# Patient Record
Sex: Female | Born: 1955 | Race: White | Hispanic: No | Smoking: Never smoker
Health system: Southern US, Community
[De-identification: ages and names within clinical notes are randomized; demographics above are authoritative.]

## PROBLEM LIST (undated history)

## (undated) DIAGNOSIS — I10 Essential (primary) hypertension: Secondary | ICD-10-CM

## (undated) DIAGNOSIS — Z87442 Personal history of urinary calculi: Secondary | ICD-10-CM

## (undated) DIAGNOSIS — R569 Unspecified convulsions: Secondary | ICD-10-CM

## (undated) DIAGNOSIS — J189 Pneumonia, unspecified organism: Secondary | ICD-10-CM

## (undated) DIAGNOSIS — G43909 Migraine, unspecified, not intractable, without status migrainosus: Secondary | ICD-10-CM

## (undated) DIAGNOSIS — H3530 Unspecified macular degeneration: Secondary | ICD-10-CM

## (undated) DIAGNOSIS — D649 Anemia, unspecified: Secondary | ICD-10-CM

## (undated) DIAGNOSIS — K759 Inflammatory liver disease, unspecified: Secondary | ICD-10-CM

## (undated) DIAGNOSIS — I499 Cardiac arrhythmia, unspecified: Secondary | ICD-10-CM

## (undated) DIAGNOSIS — M1990 Unspecified osteoarthritis, unspecified site: Secondary | ICD-10-CM

## (undated) DIAGNOSIS — F32A Depression, unspecified: Secondary | ICD-10-CM

## (undated) DIAGNOSIS — E119 Type 2 diabetes mellitus without complications: Secondary | ICD-10-CM

## (undated) DIAGNOSIS — T7840XA Allergy, unspecified, initial encounter: Secondary | ICD-10-CM

## (undated) DIAGNOSIS — G4730 Sleep apnea, unspecified: Secondary | ICD-10-CM

## (undated) DIAGNOSIS — F419 Anxiety disorder, unspecified: Secondary | ICD-10-CM

## (undated) DIAGNOSIS — F329 Major depressive disorder, single episode, unspecified: Secondary | ICD-10-CM

## (undated) HISTORY — DX: Depression, unspecified: F32.A

## (undated) HISTORY — PX: TONSILLECTOMY: SUR1361

## (undated) HISTORY — DX: Sleep apnea, unspecified: G47.30

## (undated) HISTORY — DX: Allergy, unspecified, initial encounter: T78.40XA

## (undated) HISTORY — DX: Type 2 diabetes mellitus without complications: E11.9

## (undated) HISTORY — PX: JOINT REPLACEMENT: SHX530

## (undated) HISTORY — DX: Migraine, unspecified, not intractable, without status migrainosus: G43.909

## (undated) HISTORY — DX: Major depressive disorder, single episode, unspecified: F32.9

## (undated) HISTORY — PX: COLONOSCOPY: SHX174

## (undated) HISTORY — DX: Anxiety disorder, unspecified: F41.9

## (undated) HISTORY — DX: Unspecified convulsions: R56.9

---

## 1980-05-11 HISTORY — PX: TUBAL LIGATION: SHX77

## 1980-05-11 HISTORY — PX: APPENDECTOMY: SHX54

## 2000-11-30 ENCOUNTER — Encounter: Admission: RE | Admit: 2000-11-30 | Discharge: 2000-11-30 | Payer: Self-pay | Admitting: Internal Medicine

## 2000-11-30 ENCOUNTER — Encounter: Payer: Self-pay | Admitting: Internal Medicine

## 2001-11-29 ENCOUNTER — Encounter: Admission: RE | Admit: 2001-11-29 | Discharge: 2001-11-29 | Payer: Self-pay | Admitting: Internal Medicine

## 2001-11-29 ENCOUNTER — Encounter: Payer: Self-pay | Admitting: Internal Medicine

## 2001-12-22 ENCOUNTER — Other Ambulatory Visit: Admission: RE | Admit: 2001-12-22 | Discharge: 2001-12-22 | Payer: Self-pay | Admitting: Internal Medicine

## 2004-09-12 ENCOUNTER — Other Ambulatory Visit: Admission: RE | Admit: 2004-09-12 | Discharge: 2004-09-12 | Payer: Self-pay | Admitting: Internal Medicine

## 2004-10-28 ENCOUNTER — Ambulatory Visit (HOSPITAL_COMMUNITY): Admission: RE | Admit: 2004-10-28 | Discharge: 2004-10-28 | Payer: Self-pay | Admitting: Internal Medicine

## 2004-12-12 ENCOUNTER — Encounter: Admission: RE | Admit: 2004-12-12 | Discharge: 2004-12-12 | Payer: Self-pay | Admitting: Neurology

## 2005-06-13 ENCOUNTER — Emergency Department (HOSPITAL_COMMUNITY): Admission: EM | Admit: 2005-06-13 | Discharge: 2005-06-13 | Payer: Self-pay | Admitting: Emergency Medicine

## 2005-12-02 ENCOUNTER — Encounter: Admission: RE | Admit: 2005-12-02 | Discharge: 2005-12-02 | Payer: Self-pay | Admitting: Internal Medicine

## 2006-05-31 ENCOUNTER — Ambulatory Visit (HOSPITAL_COMMUNITY): Admission: RE | Admit: 2006-05-31 | Discharge: 2006-05-31 | Payer: Self-pay | Admitting: *Deleted

## 2006-10-27 ENCOUNTER — Encounter: Admission: RE | Admit: 2006-10-27 | Discharge: 2006-10-27 | Payer: Self-pay | Admitting: Internal Medicine

## 2006-11-10 ENCOUNTER — Emergency Department (HOSPITAL_COMMUNITY): Admission: EM | Admit: 2006-11-10 | Discharge: 2006-11-10 | Payer: Self-pay | Admitting: Emergency Medicine

## 2008-04-16 ENCOUNTER — Encounter: Admission: RE | Admit: 2008-04-16 | Discharge: 2008-04-16 | Payer: Self-pay | Admitting: Internal Medicine

## 2010-01-03 ENCOUNTER — Encounter: Admission: RE | Admit: 2010-01-03 | Discharge: 2010-01-03 | Payer: Self-pay | Admitting: Internal Medicine

## 2010-01-24 ENCOUNTER — Other Ambulatory Visit: Admission: RE | Admit: 2010-01-24 | Discharge: 2010-01-24 | Payer: Self-pay | Admitting: Internal Medicine

## 2010-01-30 ENCOUNTER — Ambulatory Visit (HOSPITAL_COMMUNITY): Admission: RE | Admit: 2010-01-30 | Discharge: 2010-01-30 | Payer: Self-pay | Admitting: Urology

## 2010-09-23 NOTE — Consult Note (Signed)
NAMEHEATH, Mallory Mills                  ACCOUNT NO.:  [id number]   MEDICAL RECORD NO.:  [id number]          PATIENT TYPE:  EMS   LOCATION:  MAJO                         FACILITY:  [facility]   PHYSICIAN:  Michael L. Reynolds, M.D.DATE OF BIRTH:  27-Feb-1956   DATE OF CONSULTATION:  11/10/2006  DATE OF DISCHARGE:                                 CONSULTATION   EMERGENCY ROOM CONSULTATION:   REFERRING PHYSICIAN:  Dr. Gerlene Burdock Pang/Dr. Delia Heady.   CHIEF COMPLAINT:  Headache and eye pain following trauma.   HISTORY OF PRESENT ILLNESS:  This is an emergency room consultation  evaluation of this existing [facility] patient, a 55-  year-old woman with a history of migraine, who fell and hit the left  side of her head about 2 weeks ago.  Since then, she has had some  persistent dizziness.  However, she has not had significant problems  with headaches or eye pain.  She has seen her primary care doctor a few  times and has been placed on meclizine.  She had CT of the head the day  after her trauma and that study was normal.  Today, she woke up with a  pain in her left eye, going back into her head.  She has had associated  nausea with this, but no vomiting, photophobia or ponophobia.  She feels  a little bit of deviation on the left eye, is a little bit blurry  compared to the right.  She denies any diplopia.  She denies any  associated focal numbness, tingling or weakness.  She called the office  of [facility] and was advised to come to the  emergency department for an urgent neurologic evaluation.   REVIEW OF SYSTEMS:  No recent headaches, although the patient does have  a history of headaches diagnosed as migraines in the past.   PAST MEDICAL HISTORY:  She has:  1. A history of epilepsy for which she is on Lamictal.  2. She also has a history of anxiety, depression and restless leg      syndrome.   MEDICATIONS:  Lamictal, Restoril, Cymbalta, Klonopin,  Requip,  metoprolol.   PHYSICAL EXAMINATION:  VITAL SIGNS:  Temperature 98.1.  Blood pressure  147/96.  Pulse of 91.  Respirations 18.  O2 saturation 95% on room air.  GENERAL EXAMINATION:  This is a healthy-appearing woman seen in no  evident distress.  HEENT:  Head:  Cranium is normocephalic, atraumatic.  Oropharynx benign.  NECK:  Supple without carotid or supraclavicular bruits.  HEART:  Regular rate and rhythm without murmurs.  NEUROLOGIC EXAMINATION:  Mental status:  She is awake and alert.  She is  oriented to time, place and person.  Recent and remote are intact.  Attention span, concentration and fund of knowledge are all appropriate.  Speech is fluent and not dysarthric.  There are no defects to  confrontational, naming and she can repeat a phrase.  Mood is euthymic  and affect appropriate.  Cranial nerves:  Pupils are equal and reactive.  Extraocular movements are full without nystagmus.  Visual  fields are  full to confrontation.  Hearing is intact to conversational speech.  Visual acuity in the left eye is 20/200, in the right eye 20/70 and in  both eyes 20/50.  Face, tongue and palate move normally and  symmetrically.  Motor:  Normal bulk and tone.  Normal strength in all  tested extremity muscles.  Sensation intact to light touch in all  extremities.  Coordination:  Finger-to-nose and heel-to-shin are  performed well.  Gait:  She arises easily from the stretcher and her  stance is normal.  She is able to tandem walk without difficulty.  Reflexes are 2+ and symmetric.  Toes are downgoing bilaterally.   LABORATORY REVIEWS:  CBC and chemistries are normal.  CT of the head is  normal.   IMPRESSION:  1. Left eye pain with visual blurring.  This most likely represents a      migraine in this patient with a recent head trauma.  There is no      evidence of any neurologic injury.  2. History of migraine.  3. History of recent head trauma with mild postconcussive syndrome.    PLAN:  She is advised to take Tylenol this evening.  If her symptoms  persist into tomorrow, she should follow up with her eye doctor.  She  can also call our office for other medication considerations.      Michael L. Thad Ranger, M.D.  Electronically Signed     MLR/MEDQ  D:  11/10/2006  T:  11/11/2006  Job:  [id number]

## 2010-09-26 NOTE — Op Note (Signed)
M[place]ory Mills, Mallory Mills                  ACCOUNT NO.:  [id number]   MEDICAL RECORD NO.:  [id number]          PATIENT TYPE:  AMB   LOCATION:  ENDO                         FACILITY:  [facility]   PHYSICIAN:  Georgiana Spinner, M.D.    DATE OF BIRTH:  1955/10/23   DATE OF PROCEDURE:  05/31/2006  DATE OF DISCHARGE:                               OPERATIVE REPORT   PROCEDURE:  Colonoscopy.   INDICATIONS:  Colon cancer screening.   ANESTHESIA:  Fentanyl 50 mcg, Versed 7 mg   DESCRIPTION OF PROCEDURE:  With the patient mildly sedated in the left  lateral decubitus position; she was subsequently rolled to her back.  With pressure applied, the Pentax videoscopic colonoscope was inserted  into the rectum and passed under direct vision to the cecum identified  by ileocecal valve and appendiceal orifice both of which were  photographed.  From this point, the colonoscope was slowly withdrawn  taking circumferential views of colonic mucosa stopping only in the  rectum which appeared normal on direct.  It showed hemorrhoids on  retroflexed view.  The endoscope was straightened and withdrawn.  The  patient's vital signs; and pulse oximeter remained stable.  The patient  tolerated the procedure well without apparent complications.   FINDINGS:  Internal hemorrhoids, otherwise an unremarkable examination.  There were diverticula seen as well.   PLAN:  Have the patient follow up with me in 5-10 years or as needed.           ______________________________  Georgiana Spinner, M.D.     GMO/MEDQ  D:  05/31/2006  T:  05/31/2006  Job:  [id number]

## 2010-09-26 NOTE — Op Note (Signed)
M[place] Mills, Mallory Mills                  ACCOUNT NO.:  [id number]   MEDICAL RECORD NO.:  [id number]          PATIENT TYPE:  AMB   LOCATION:  ENDO                         FACILITY:  [facility]   PHYSICIAN:  Georgiana Spinner, M.D.    DATE OF BIRTH:  1956/04/10   DATE OF PROCEDURE:  05/31/2006  DATE OF DISCHARGE:                               OPERATIVE REPORT   SURGEON:  Georgiana Spinner, M.D.   PROCEDURE:  Upper endoscopy.   INDICATIONS:  GERD.   ANESTHESIA:  Fentanyl 50 mcg, Versed 5 mg.   PROCEDURE:  With the patient mildly sedated in the left lateral  decubitus position, the Pentax videoscopic endoscope was inserted in the  mouth and passed under direct vision through the esophagus, which  appeared normal, and into the stomach.  The fundus, body, antrum,  duodenal bulb and second portion of the duodenum appeared normal.  From  this point, the endoscope was slowly withdrawn, taking circumferential  views of the duodenal mucosa, until the endoscope was then pulled back  into the stomach and placed in retroflexion to view the stomach from  below.  The endoscope was then straightened and withdrawn, taking  circumferential views of the remaining gastric and esophageal mucosa.  The patient's vital signs and pulse oximetry remained stable.  The  patient tolerated the procedure well without apparent complication.   FINDINGS:  Unremarkable examination.   PLAN:  Proceed to colonoscopy.           ______________________________  Georgiana Spinner, M.D.     GMO/MEDQ  D:  05/31/2006  T:  05/31/2006  Job:  [id number]

## 2010-09-26 NOTE — Procedures (Signed)
CLINICAL HISTORY:  A 54 year old lady with episode of passing out for 3 to 4  minutes _________ of body or so due to the fall. EEG is being done to  evaluate for seizure activity.   MEDICATIONS:  Zoloft, metoprolol, Prempro, and Temazepam.   DESCRIPTION OF PROCEDURE:  This is a 17 channel EEG performed during wakeful  states and light sleep using standard 10/20 electrode placement. Background  awake rhythm consists of 9 to 10 hertz alpha, which is poorly formed  synchronous active to opening and closure. It is mixed with 6 to 7 hertz  theta activity in a diffuse fashion. Intermittent left temporal sharp waves  and focal slowing is seen with phase reversal over T3 and T5 electrodes. No  paroxysmal epileptiform activity is identified. Hyperventilation results in  generalized slowing. Photic stimulation results in no significant driving  response. Length of the tracing is 24.2 minutes. Sleep traces are not  achieved except for mild drowsiness seen towards the end of the tracing. EKG  tracing reveals regular sinus rhythm. Technical component is average.  Excessive _________ is seen throughout the tracing, which may be a  medication effect.   IMPRESSION:  This EEG is abnormal due to the presence of focal left temporal  cortical irritability and slowing. No definite epileptiform activity,  however, is identified. If seizures are strongly suspected, a repeat sleep  deprived study may be beneficial.       ZOX:WRUE  D:  10/28/2004 15:13:41  T:  10/29/2004 01:35:26  Job #:  [id number]

## 2011-01-14 ENCOUNTER — Ambulatory Visit (INDEPENDENT_AMBULATORY_CARE_PROVIDER_SITE_OTHER): Payer: 59 | Admitting: General Surgery

## 2011-01-14 ENCOUNTER — Encounter (INDEPENDENT_AMBULATORY_CARE_PROVIDER_SITE_OTHER): Payer: Self-pay | Admitting: General Surgery

## 2011-01-14 VITALS — BP 124/86 | HR 80 | Temp 97.8°F | Ht 63.0 in | Wt 197.2 lb

## 2011-01-14 DIAGNOSIS — IMO0002 Reserved for concepts with insufficient information to code with codable children: Secondary | ICD-10-CM

## 2011-01-14 DIAGNOSIS — L02419 Cutaneous abscess of limb, unspecified: Secondary | ICD-10-CM

## 2011-01-14 NOTE — Patient Instructions (Signed)
Wash the area twice daily and keep covered. Finishing here antibiotics. Call if any increasing pain, redness, swelling, drainage, or fever.

## 2011-01-14 NOTE — Progress Notes (Signed)
History: Patient is a 55 year old female here through the courtesy of [facility] for treatment of a right axillary abscess. The patient began to develop some pain and swelling last week. She was seen Monday and incision and drainage was performed. She was followed up today, 2 days later and the drain was removed. There was concern about continued induration and erythema and she is referred for evaluation. She received 1 g of Rocephin IV today and is given a prescription for Septra. The patient states that since the drain or gauze was removed it is feeling significantly better and better than it did before the I&D. She has had one previous abscess in the area treated.  Exam: She is afebrile and in no distress. In the anterior right axilla is a recent open I&D site and about 4-5 cm of surrounding moderate erythema and induration. I did not feel any areas of fluctuance. The wound was explored and cleaned with a cotton applicator and appears well drained.  Assessment and plan: Status post I&D of right axillary abscess. She has some residual inflammation but I do not see any evidence that this requires further drainage. She appears to be on appropriate antibiotics. We discussed signs to watch for and she will call for any concerns. She will call next week for followup if this is not essentially resolved.

## 2011-02-24 LAB — CBC
Platelets: 315
RBC: 4.78
RDW: 13.9

## 2011-02-24 LAB — DIFFERENTIAL
Basophils Relative: 0
Lymphocytes Relative: 25
Lymphs Abs: 1.3

## 2011-02-24 LAB — I-STAT 8, (EC8 V) (CONVERTED LAB)
Bicarbonate: 26 — ABNORMAL HIGH
Potassium: 3.6
Sodium: 138
pH, Ven: 7.438 — ABNORMAL HIGH

## 2011-02-24 LAB — POCT I-STAT CREATININE: Operator id: 146091

## 2012-02-01 ENCOUNTER — Other Ambulatory Visit: Payer: Self-pay | Admitting: Internal Medicine

## 2012-02-01 DIAGNOSIS — R748 Abnormal levels of other serum enzymes: Secondary | ICD-10-CM

## 2012-02-02 ENCOUNTER — Ambulatory Visit
Admission: RE | Admit: 2012-02-02 | Discharge: 2012-02-02 | Disposition: A | Discharge status: Home / Self Care | Payer: 59 | Source: Ambulatory Visit | Attending: Internal Medicine | Admitting: Internal Medicine

## 2012-02-02 DIAGNOSIS — R748 Abnormal levels of other serum enzymes: Secondary | ICD-10-CM

## 2012-09-07 ENCOUNTER — Ambulatory Visit (INDEPENDENT_AMBULATORY_CARE_PROVIDER_SITE_OTHER): Payer: 59 | Admitting: Family Medicine

## 2012-09-07 VITALS — BP 161/84 | HR 66 | Temp 98.0°F | Resp 16 | Ht 63.0 in | Wt 196.0 lb

## 2012-09-07 DIAGNOSIS — R002 Palpitations: Secondary | ICD-10-CM

## 2012-09-07 DIAGNOSIS — G8929 Other chronic pain: Secondary | ICD-10-CM | POA: Insufficient documentation

## 2012-09-07 DIAGNOSIS — M546 Pain in thoracic spine: Secondary | ICD-10-CM

## 2012-09-07 MED ORDER — METHYLPREDNISOLONE 4 MG PO TABS: 8.0000 mg | ORAL_TABLET | Freq: Every day | ORAL | Status: DC

## 2012-09-07 NOTE — Progress Notes (Signed)
57 yo woman with two days of spontaneous thoracic back pain, burning in quality and made worse moving, coughing or sneezing.  No past hx of similar pain. Lately, only other problem has been allergies with some sneezing  Sig Negs:  No leg pains or lower back pain, No significant cough or fever.  Rx:  Ibuprofen and tylenol: no help  Objective:  NAD Chest clear, tender superior aspect of medial scapular edge Skin: clear Heart:  Regular, no murmur' Ext: no edema  Assessment;  Levator scapulae strain  Plan:

## 2013-08-24 ENCOUNTER — Other Ambulatory Visit (HOSPITAL_COMMUNITY)
Admission: RE | Admit: 2013-08-24 | Discharge: 2013-08-24 | Disposition: A | Discharge status: Home / Self Care | Payer: 59 | Source: Ambulatory Visit | Attending: Internal Medicine | Admitting: Internal Medicine

## 2013-08-24 DIAGNOSIS — Z01419 Encounter for gynecological examination (general) (routine) without abnormal findings: Secondary | ICD-10-CM | POA: Insufficient documentation

## 2014-08-30 ENCOUNTER — Emergency Department (HOSPITAL_COMMUNITY): Payer: Worker's Compensation

## 2014-08-30 ENCOUNTER — Encounter (HOSPITAL_COMMUNITY): Payer: Self-pay | Admitting: Emergency Medicine

## 2014-08-30 ENCOUNTER — Emergency Department (HOSPITAL_COMMUNITY)
Admission: EM | Admit: 2014-08-30 | Discharge: 2014-08-30 | Disposition: A | Discharge status: Home / Self Care | Payer: Worker's Compensation | Attending: Emergency Medicine | Admitting: Emergency Medicine

## 2014-08-30 DIAGNOSIS — Z79899 Other long term (current) drug therapy: Secondary | ICD-10-CM | POA: Diagnosis not present

## 2014-08-30 DIAGNOSIS — Y998 Other external cause status: Secondary | ICD-10-CM | POA: Insufficient documentation

## 2014-08-30 DIAGNOSIS — T148 Other injury of unspecified body region: Secondary | ICD-10-CM | POA: Insufficient documentation

## 2014-08-30 DIAGNOSIS — S96912A Strain of unspecified muscle and tendon at ankle and foot level, left foot, initial encounter: Secondary | ICD-10-CM | POA: Insufficient documentation

## 2014-08-30 DIAGNOSIS — S46911A Strain of unspecified muscle, fascia and tendon at shoulder and upper arm level, right arm, initial encounter: Secondary | ICD-10-CM | POA: Insufficient documentation

## 2014-08-30 DIAGNOSIS — F419 Anxiety disorder, unspecified: Secondary | ICD-10-CM | POA: Insufficient documentation

## 2014-08-30 DIAGNOSIS — Y9389 Activity, other specified: Secondary | ICD-10-CM | POA: Insufficient documentation

## 2014-08-30 DIAGNOSIS — I10 Essential (primary) hypertension: Secondary | ICD-10-CM | POA: Insufficient documentation

## 2014-08-30 DIAGNOSIS — T148XXA Other injury of unspecified body region, initial encounter: Secondary | ICD-10-CM

## 2014-08-30 DIAGNOSIS — Y9289 Other specified places as the place of occurrence of the external cause: Secondary | ICD-10-CM | POA: Insufficient documentation

## 2014-08-30 DIAGNOSIS — S29011A Strain of muscle and tendon of front wall of thorax, initial encounter: Secondary | ICD-10-CM | POA: Diagnosis not present

## 2014-08-30 DIAGNOSIS — S3991XA Unspecified injury of abdomen, initial encounter: Secondary | ICD-10-CM | POA: Insufficient documentation

## 2014-08-30 DIAGNOSIS — F329 Major depressive disorder, single episode, unspecified: Secondary | ICD-10-CM | POA: Insufficient documentation

## 2014-08-30 DIAGNOSIS — T07XXXA Unspecified multiple injuries, initial encounter: Secondary | ICD-10-CM

## 2014-08-30 DIAGNOSIS — S060X9A Concussion with loss of consciousness of unspecified duration, initial encounter: Secondary | ICD-10-CM | POA: Insufficient documentation

## 2014-08-30 DIAGNOSIS — S0990XA Unspecified injury of head, initial encounter: Secondary | ICD-10-CM | POA: Diagnosis present

## 2014-08-30 DIAGNOSIS — W108XXA Fall (on) (from) other stairs and steps, initial encounter: Secondary | ICD-10-CM | POA: Diagnosis not present

## 2014-08-30 DIAGNOSIS — G40909 Epilepsy, unspecified, not intractable, without status epilepticus: Secondary | ICD-10-CM | POA: Insufficient documentation

## 2014-08-30 DIAGNOSIS — Z7951 Long term (current) use of inhaled steroids: Secondary | ICD-10-CM | POA: Insufficient documentation

## 2014-08-30 DIAGNOSIS — W19XXXA Unspecified fall, initial encounter: Secondary | ICD-10-CM

## 2014-08-30 HISTORY — DX: Essential (primary) hypertension: I10

## 2014-08-30 MED ORDER — OXYCODONE-ACETAMINOPHEN 5-325 MG PO TABS: 1.0000 | ORAL_TABLET | Freq: Four times a day (QID) | ORAL | Status: DC | PRN

## 2014-08-30 MED ORDER — MORPHINE SULFATE 4 MG/ML IJ SOLN
4.0000 mg | Freq: Once | INTRAMUSCULAR | Status: AC
  Administered 2014-08-30: 4 mg via INTRAVENOUS
  Filled 2014-08-30: qty 1

## 2014-08-30 NOTE — ED Notes (Signed)
Patient still off unit for procedure, sent by another nurse.

## 2014-08-30 NOTE — ED Provider Notes (Signed)
CSN: [id number]     Arrival date & time 08/30/14  1416 History   First MD Initiated Contact with Patient 08/30/14 1459     Chief Complaint  Patient presents with  . Fall  . Knee Pain    left  . Shoulder Pain    right  . Flank Pain    right    Patient is a 59 y.o. female presenting with fall, knee pain, shoulder pain, and flank pain. The history is provided by the patient.  Fall This is a new problem. The current episode started less than 1 hour ago. The problem occurs constantly. The problem has not changed since onset.Associated symptoms include headaches. Pertinent negatives include no abdominal pain and no shortness of breath.  Knee Pain Shoulder Pain Flank Pain Associated symptoms include headaches. Pertinent negatives include no abdominal pain and no shortness of breath.  Pt fell down 9 concrete steps coming out of work today.  Most of the impact was on her left knee and her head.  She is not sure if she briefly lost consciousness.  She was not able to stand after the fall.  She is having pain in her right shoulder, right ribs, left ankle, and left knee as well.  No abdominal pain.  No shortness of breath.    Past Medical History  Diagnosis Date  . Seizures   . Allergy   . Depression   . Anxiety   . Hypertension    Past Surgical History  Procedure Laterality Date  . Cesarean section    . Tubal ligation     Family History  Problem Relation Age of Onset  . Cancer Mother     lung  . Cancer Maternal Aunt     breast  . Cancer Paternal Aunt     breast  . Heart disease Father   . Gout Son   . Heart disease Maternal Grandmother    History  Substance Use Topics  . Smoking status: Never Smoker   . Smokeless tobacco: Not on file  . Alcohol Use: Yes     Comment: 1-2/month   OB History    No data available     Review of Systems  Respiratory: Negative for shortness of breath.   Gastrointestinal: Negative for abdominal pain.  Genitourinary: Positive for flank pain.   Neurological: Positive for headaches.  All other systems reviewed and are negative.     Allergies  Vicodin  Home Medications   Prior to Admission medications   Medication Sig Start Date End Date Taking? Authorizing Provider  clonazePAM (KLONOPIN) 1 MG tablet Take 1 mg by mouth daily as needed for anxiety.   Yes Historical Provider, MD  cyproheptadine (PERIACTIN) 4 MG tablet Take 4 mg by mouth 3 (three) times daily as needed for allergies.   Yes Historical Provider, MD  fluticasone (FLONASE) 50 MCG/ACT nasal spray Place 1 spray into both nostrils daily.   Yes Historical Provider, MD  lamoTRIgine (LAMICTAL) 100 MG tablet Take 100 mg by mouth 2 (two) times daily. i pill in am and 2 pills in pm     Yes Historical Provider, MD  losartan (COZAAR) 100 MG tablet Take 100 mg by mouth daily.   Yes Historical Provider, MD  metoprolol (LOPRESSOR) 50 MG tablet Take 50 mg by mouth 2 (two) times daily.   Yes Historical Provider, MD  ranitidine (ZANTAC) 150 MG tablet Take 150 mg by mouth 2 (two) times daily.   Yes Historical Provider, MD  rizatriptan (  MAXALT-MLT) 10 MG disintegrating tablet Take 10 mg by mouth as needed for migraine. May repeat in 2 hours if needed   Yes Historical Provider, MD  traMADol (ULTRAM) 50 MG tablet Take 50 mg by mouth daily as needed.   Yes Historical Provider, MD  venlafaxine XR (EFFEXOR-XR) 75 MG 24 hr capsule Take 75 mg by mouth daily with breakfast.   Yes Historical Provider, MD  zolpidem (AMBIEN) 10 MG tablet Take 10 mg by mouth at bedtime as needed for sleep.   Yes Historical Provider, MD  ALPRAZolam (XANAX) 0.25 MG tablet Take 0.25 mg by mouth daily.    Historical Provider, MD  butalbital-acetaminophen-caffeine (FIORICET, ESGIC) 50-325-40 MG per tablet Take 1 tablet by mouth as needed for headache.    Historical Provider, MD  methylPREDNISolone (MEDROL) 4 MG tablet Take 2 tablets (8 mg total) by mouth daily. 09/07/12   Elvina Sidle, MD  METOPROLOL TARTRATE PO Take  by mouth 2 (two) times daily.      Historical Provider, MD  oxyCODONE-acetaminophen (PERCOCET/ROXICET) 5-325 MG per tablet Take 1-2 tablets by mouth every 6 (six) hours as needed. 08/30/14   Linwood Dibbles, MD   BP 166/90 mmHg  Pulse 77  Temp(Src) 98.5 F (36.9 C) (Oral)  Resp 17  Ht 5\' 3"  (1.6 m)  Wt 200 lb (90.719 kg)  BMI 35.44 kg/m2  SpO2 100% Physical Exam  Constitutional: No distress.  HENT:  Head: Normocephalic and atraumatic.  Right Ear: External ear normal.  Left Ear: External ear normal.  Eyes: Conjunctivae are normal. Right eye exhibits no discharge. Left eye exhibits no discharge. No scleral icterus.  Neck: Neck supple. No tracheal deviation present.  Wearing a cervical spine collar, No spinous process tenderness  Cardiovascular: Normal rate, regular rhythm and intact distal pulses.   Pulmonary/Chest: Effort normal and breath sounds normal. No stridor. No respiratory distress. She has no wheezes. She has no rales. She exhibits tenderness (right chest wall, no crepitus no ecchymoses).  Abdominal: Soft. Bowel sounds are normal. She exhibits no distension. There is no tenderness. There is no rebound and no guarding.  Musculoskeletal: She exhibits no edema.       Right shoulder: She exhibits tenderness and bony tenderness. She exhibits no swelling and no deformity.       Left shoulder: Normal.       Right elbow: Normal.      Left elbow: Normal.       Right wrist: Normal.       Left wrist: Normal.       Right hip: Normal.       Left hip: Normal.       Right knee: Normal.       Left knee: She exhibits no swelling, no effusion and no laceration (positive for abrasion). Tenderness found.       Right ankle: Normal.       Left ankle: She exhibits no laceration. Tenderness. Lateral malleolus and medial malleolus tenderness found.       Cervical back: Normal.       Thoracic back: Normal.       Lumbar back: Normal.       Right foot: Normal.       Left foot: Normal.  Neurological:  She is alert. She has normal strength. No cranial nerve deficit (no facial droop, extraocular movements intact, no slurred speech) or sensory deficit. She exhibits normal muscle tone. She displays no seizure activity. Coordination normal.  Skin: Skin is warm and dry.  No rash noted. She is not diaphoretic.  Psychiatric: She has a normal mood and affect.  Nursing note and vitals reviewed.   ED Course  Procedures (including critical care time) Labs Review Labs Reviewed - No data to display  Imaging Review Dg Ribs Unilateral W/chest Right  08/30/2014   CLINICAL DATA:  Post fall down 8 concrete stairs leaving work today, now with right lower posterior rib pain. Initial encounter.  EXAM: RIGHT RIBS AND CHEST - 3+ VIEW  COMPARISON:  None.  FINDINGS: No definite displaced right-sided rib fractures. Regional soft tissues appear normal. No radiopaque foreign body.  Borderline enlarged cardiac silhouette and mediastinal contours, potentially accentuated due to decreased lung volumes. Minimal bibasilar opacities, left greater than right, likely atelectasis. No pleural effusion or pneumothorax. No evidence of edema. No acute osseus abnormalities.  IMPRESSION: No definite displaced right-sided rib fractures.   Electronically Signed   By: Simonne ComeJohn  Watts M.D.   On: 08/30/2014 16:19   Dg Shoulder Right  08/30/2014   CLINICAL DATA:  Fall, shoulder pain, flank pain  EXAM: RIGHT SHOULDER - 2+ VIEW  COMPARISON:  None.  FINDINGS: Two views of the right shoulder submitted. No acute fracture or subluxation. Degenerative changes are noted humeral head with inferomedial spurring. Minimal narrowing of glenohumeral joint space.  IMPRESSION: No acute fracture or subluxation. Degenerative changes as described above.   Electronically Signed   By: Natasha MeadLiviu  Pop M.D.   On: 08/30/2014 16:22   Dg Ankle Complete Left  08/30/2014   CLINICAL DATA:  Fall, knee pain, left ankle pain  EXAM: LEFT ANKLE COMPLETE - 3+ VIEW  COMPARISON:  None.   FINDINGS: Three views of the left ankle submitted. No acute fracture or subluxation. Ankle mortise is preserved.  IMPRESSION: Negative.   Electronically Signed   By: Natasha MeadLiviu  Pop M.D.   On: 08/30/2014 16:20   Ct Head Wo Contrast  08/30/2014   CLINICAL DATA:  Fall, tripped, fell down 8 steps, headache  EXAM: CT HEAD WITHOUT CONTRAST  CT CERVICAL SPINE WITHOUT CONTRAST  TECHNIQUE: Multidetector CT imaging of the head and cervical spine was performed following the standard protocol without intravenous contrast. Multiplanar CT image reconstructions of the cervical spine were also generated.  COMPARISON:  11/10/2006  FINDINGS: CT HEAD FINDINGS  No skull fracture is noted. Paranasal sinuses and mastoid air cells are unremarkable.  No intracranial hemorrhage, mass effect or midline shift.  No acute cortical infarction. No mass lesion is noted on this unenhanced scan. The gray and white-matter differentiation is preserved.  CT CERVICAL SPINE FINDINGS  Axial images of the cervical spine shows no acute fracture or subluxation. Computer processed images shows no acute fracture or subluxation. Degenerative changes are noted C1-C2 articulation. There is minimal disc space flattening with mild posterior spurring at C3-C4 level. Moderate disc space flattening with mild anterior and mild posterior spurring at C6-C7 level. Mild disc space flattening with anterior spurring at C7-T1 level. No prevertebral soft tissue swelling. Mild anterior spurring lower endplate of C5 vertebral body. Cervical airway is patent.  There is no pneumothorax in visualized lung apices.  IMPRESSION: 1. No acute intracranial abnormality. 2. No cervical spine acute fracture or subluxation. Degenerative changes as described above.   Electronically Signed   By: Natasha MeadLiviu  Pop M.D.   On: 08/30/2014 16:44   Ct Cervical Spine Wo Contrast  08/30/2014   CLINICAL DATA:  Fall, tripped, fell down 8 steps, headache  EXAM: CT HEAD WITHOUT CONTRAST  CT CERVICAL SPINE  WITHOUT CONTRAST  TECHNIQUE: Multidetector CT imaging of the head and cervical spine was performed following the standard protocol without intravenous contrast. Multiplanar CT image reconstructions of the cervical spine were also generated.  COMPARISON:  11/10/2006  FINDINGS: CT HEAD FINDINGS  No skull fracture is noted. Paranasal sinuses and mastoid air cells are unremarkable.  No intracranial hemorrhage, mass effect or midline shift.  No acute cortical infarction. No mass lesion is noted on this unenhanced scan. The gray and white-matter differentiation is preserved.  CT CERVICAL SPINE FINDINGS  Axial images of the cervical spine shows no acute fracture or subluxation. Computer processed images shows no acute fracture or subluxation. Degenerative changes are noted C1-C2 articulation. There is minimal disc space flattening with mild posterior spurring at C3-C4 level. Moderate disc space flattening with mild anterior and mild posterior spurring at C6-C7 level. Mild disc space flattening with anterior spurring at C7-T1 level. No prevertebral soft tissue swelling. Mild anterior spurring lower endplate of C5 vertebral body. Cervical airway is patent.  There is no pneumothorax in visualized lung apices.  IMPRESSION: 1. No acute intracranial abnormality. 2. No cervical spine acute fracture or subluxation. Degenerative changes as described above.   Electronically Signed   By: Natasha Mead M.D.   On: 08/30/2014 16:44   Dg Knee Complete 4 Views Left  08/30/2014   CLINICAL DATA:  Fall down 8 concrete stairs at work today with left knee pain.  EXAM: LEFT KNEE - COMPLETE 4+ VIEW  COMPARISON:  None.  FINDINGS: No evidence of acute fracture or dislocation. No significant joint effusion. Well corticated 5 mm fragment over the intercondylar notch likely a small loose body.  IMPRESSION: No acute findings.  5 mm loose body over the intercondylar notch.   Electronically Signed   By: Elberta Fortis M.D.   On: 08/30/2014 16:20     MDM   Final diagnoses:  Fall  Concussion, with loss of consciousness of unspecified duration, initial encounter  Muscle strain  Multiple contusions    No signs of serious injury associated with her fall.    Consistent with soft tissue injury/strain.  Explained findings to patient and warning signs that should prompt return to the ED.    Linwood Dibbles, MD 08/30/14 [phone]

## 2014-08-30 NOTE — Discharge Instructions (Signed)
Concussion A concussion is a brain injury. It is caused by:  A hit to the head.  A quick and sudden movement (jolt) of the head or neck. A concussion is usually not life threatening. Even so, it can cause serious problems. If you had a concussion before, you may have concussion-like problems after a hit to your head. HOME CARE General Instructions  Follow your doctor's directions carefully.  Take medicines only as told by your doctor.  Only take medicines your doctor says are safe.  Do not drink alcohol until your doctor says it is okay. Alcohol and some drugs can slow down healing. They can also put you at risk for further injury.  If you are having trouble remembering things, write them down.  Try to do one thing at a time if you get distracted easily. For example, do not watch TV while making dinner.  Talk to your family members or close friends when making important decisions.  Follow up with your doctor as told.  Watch your symptoms. Tell others to do the same. Serious problems can sometimes happen after a concussion. Older adults are more likely to have these problems.  Tell your teachers, school nurse, school counselor, coach, Event organiserathletic trainer, or work Production designer, theatre/television/filmmanager about your concussion. Tell them about what you can or cannot do. They should watch to see if:  It gets even harder for you to pay attention or concentrate.  It gets even harder for you to remember things or learn new things.  You need more time than normal to finish things.  You become annoyed (irritable) more than before.  You are not able to deal with stress as well.  You have more problems than before.  Rest. Make sure you:  Get plenty of sleep at night.  Go to sleep early.  Go to bed at the same time every day. Try to wake up at the same time.  Rest during the day.  Take naps when you feel tired.  Limit activities where you have to think a lot or concentrate. These include:  Doing  homework.  Doing work related to a job.  Watching TV.  Using the computer. Returning To Your Regular Activities Return to your normal activities slowly, not all at once. You must give your body and brain enough time to heal.   Do not play sports or do other athletic activities until your doctor says it is okay.  Ask your doctor when you can drive, ride a bicycle, or work other vehicles or machines. Never do these things if you feel dizzy.  Ask your doctor about when you can return to work or school. Preventing Another Concussion It is very important to avoid another brain injury, especially before you have healed. In rare cases, another injury can lead to permanent brain damage, brain swelling, or death. The risk of this is greatest during the first 7-10 days after your injury. Avoid injuries by:   Wearing a seat belt when riding in a car.  Not drinking too much alcohol.  Avoiding activities that could lead to a second concussion (such as contact sports).  Wearing a helmet when doing activities like:  Biking.  Skiing.  Skateboarding.  Skating.  Making your home safer by:  Removing things from the floor or stairways that could make you trip.  Using grab bars in bathrooms and handrails by stairs.  Placing non-slip mats on floors and in bathtubs.  Improve lighting in dark areas. GET HELP IF:  It  gets even harder for you to pay attention or concentrate.  It gets even harder for you to remember things or learn new things.  You need more time than normal to finish things.  You become annoyed (irritable) more than before.  You are not able to deal with stress as well.  You have more problems than before.  You have problems keeping your balance.  You are not able to react quickly when you should. Get help if you have any of these problems for more than 2 weeks:   Lasting (chronic) headaches.  Dizziness or trouble balancing.  Feeling sick to your stomach  (nausea).  Seeing (vision) problems.  Being affected by noises or light more than normal.  Feeling sad, low, down in the dumps, blue, gloomy, or empty (depressed).  Mood changes (mood swings).  Feeling of fear or nervousness about what may happen (anxiety).  Feeling annoyed.  Memory problems.  Problems concentrating or paying attention.  Sleep problems.  Feeling tired all the time. GET HELP RIGHT AWAY IF:   You have bad headaches or your headaches get worse.  You have weakness (even if it is in one hand, leg, or part of the face).  You have loss of feeling (numbness).  You feel off balance.  You keep throwing up (vomiting).  You feel tired.  One black center of your eye (pupil) is larger than the other.  You twitch or shake violently (convulse).  Your speech is not clear (slurred).  You are more confused, easily angered (agitated), or annoyed than before.  You have more trouble resting than before.  You are unable to recognize people or places.  You have neck pain.  It is difficult to wake you up.  You have unusual behavior changes.  You pass out (lose consciousness). MAKE SURE YOU:   Understand these instructions.  Will watch your condition.  Will get help right away if you are not doing well or get worse. Document Released: 04/15/2009 Document Revised: 09/11/2013 Document Reviewed: 11/17/2012 [facility] Patient Information 2015 [place], [place]. This information is not intended to replace advice given to you by your health care provider. Make sure you discuss any questions you have with your health care provider. Muscle Strain A muscle strain is an injury that occurs when a muscle is stretched beyond its normal length. Usually a small number of muscle fibers are torn when this happens. Muscle strain is rated in degrees. First-degree strains have the least amount of muscle fiber tearing and pain. Second-degree and third-degree strains have increasingly  more tearing and pain.  Usually, recovery from muscle strain takes 1-2 weeks. Complete healing takes 5-6 weeks.  CAUSES  Muscle strain happens when a sudden, violent force placed on a muscle stretches it too far. This may occur with lifting, sports, or a fall.  RISK FACTORS Muscle strain is especially common in athletes.  SIGNS AND SYMPTOMS At the site of the muscle strain, there may be:  Pain.  Bruising.  Swelling.  Difficulty using the muscle due to pain or lack of normal function. DIAGNOSIS  Your health care provider will perform a physical exam and ask about your medical history. TREATMENT  Often, the best treatment for a muscle strain is resting, icing, and applying cold compresses to the injured area.  HOME CARE INSTRUCTIONS   Use the PRICE method of treatment to promote muscle healing during the first 2-3 days after your injury. The PRICE method involves:  Protecting the muscle from being injured  again.  Restricting your activity and resting the injured body part.  Icing your injury. To do this, put ice in a plastic bag. Place a towel between your skin and the bag. Then, apply the ice and leave it on from 15-20 minutes each hour. After the third day, switch to moist heat packs.  Apply compression to the injured area with a splint or elastic bandage. Be careful not to wrap it too tightly. This may interfere with blood circulation or increase swelling.  Elevate the injured body part above the level of your heart as often as you can.  Only take over-the-counter or prescription medicines for pain, discomfort, or fever as directed by your health care provider.  Warming up prior to exercise helps to prevent future muscle strains. SEEK MEDICAL CARE IF:   You have increasing pain or swelling in the injured area.  You have numbness, tingling, or a significant loss of strength in the injured area. MAKE SURE YOU:   Understand these instructions.  Will watch your  condition.  Will get help right away if you are not doing well or get worse. Document Released: 04/27/2005 Document Revised: 02/15/2013 Document Reviewed: 11/24/2012 [facility] Patient Information 2015 [place], [place]. This information is not intended to replace advice given to you by your health care provider. Make sure you discuss any questions you have with your health care provider.

## 2014-08-30 NOTE — ED Notes (Addendum)
Per EMS- patient tripped down approximately 8 concrete stairs coming out of work.  Patient struck her head with no loss of consciousness. VS: 166/101, HR 73, SpO2 97.  Patient is A&Ox4.  Patient c/o right shoulder, flank, and left knee pain.  Patient has h/o HTN. 20 gauge AC placed in the field.  Patient states that she "crawled to find help after falling". Patient also states she has pai in her ribs and head. Patient states that her" head hit the hardest".

## 2014-08-30 NOTE — ED Notes (Signed)
MD at bedside. 

## 2014-12-04 HISTORY — PX: KNEE SURGERY: SHX244

## 2015-01-29 ENCOUNTER — Encounter (HOSPITAL_COMMUNITY): Payer: Self-pay | Admitting: Emergency Medicine

## 2015-01-29 ENCOUNTER — Emergency Department (HOSPITAL_COMMUNITY): Payer: Commercial Managed Care - HMO

## 2015-01-29 ENCOUNTER — Emergency Department (HOSPITAL_COMMUNITY)
Admission: EM | Admit: 2015-01-29 | Discharge: 2015-01-30 | Disposition: A | Discharge status: Home / Self Care | Payer: Commercial Managed Care - HMO | Attending: Emergency Medicine | Admitting: Emergency Medicine

## 2015-01-29 DIAGNOSIS — F329 Major depressive disorder, single episode, unspecified: Secondary | ICD-10-CM | POA: Insufficient documentation

## 2015-01-29 DIAGNOSIS — G43809 Other migraine, not intractable, without status migrainosus: Secondary | ICD-10-CM

## 2015-01-29 DIAGNOSIS — I10 Essential (primary) hypertension: Secondary | ICD-10-CM | POA: Insufficient documentation

## 2015-01-29 DIAGNOSIS — Z79899 Other long term (current) drug therapy: Secondary | ICD-10-CM | POA: Diagnosis not present

## 2015-01-29 DIAGNOSIS — F419 Anxiety disorder, unspecified: Secondary | ICD-10-CM | POA: Diagnosis not present

## 2015-01-29 DIAGNOSIS — G40909 Epilepsy, unspecified, not intractable, without status epilepticus: Secondary | ICD-10-CM | POA: Diagnosis not present

## 2015-01-29 DIAGNOSIS — R51 Headache: Secondary | ICD-10-CM | POA: Diagnosis present

## 2015-01-29 DIAGNOSIS — Z7951 Long term (current) use of inhaled steroids: Secondary | ICD-10-CM | POA: Diagnosis not present

## 2015-01-29 LAB — CBC WITH DIFFERENTIAL/PLATELET
BASOS ABS: 0 10*3/uL (ref 0.0–0.1)
Basophils Relative: 0 %
EOS ABS: 0 10*3/uL (ref 0.0–0.7)
Eosinophils Relative: 0 %
HCT: 39.4 % (ref 36.0–46.0)
HEMOGLOBIN: 12.8 g/dL (ref 12.0–15.0)
Lymphocytes Relative: 25 %
Lymphs Abs: 2.1 10*3/uL (ref 0.7–4.0)
MCH: 28.8 pg (ref 26.0–34.0)
MCHC: 32.5 g/dL (ref 30.0–36.0)
MCV: 88.5 fL (ref 78.0–100.0)
Monocytes Absolute: 0.6 10*3/uL (ref 0.1–1.0)
Monocytes Relative: 7 %
NEUTROS PCT: 68 %
Neutro Abs: 5.5 10*3/uL (ref 1.7–7.7)
PLATELETS: 318 10*3/uL (ref 150–400)
RBC: 4.45 MIL/uL (ref 3.87–5.11)
RDW: 13.9 % (ref 11.5–15.5)
WBC: 8.2 10*3/uL (ref 4.0–10.5)

## 2015-01-29 LAB — BASIC METABOLIC PANEL
ANION GAP: 11 (ref 5–15)
BUN: 14 mg/dL (ref 6–20)
CHLORIDE: 101 mmol/L (ref 101–111)
CO2: 27 mmol/L (ref 22–32)
Calcium: 10 mg/dL (ref 8.9–10.3)
Creatinine, Ser: 1.21 mg/dL — ABNORMAL HIGH (ref 0.44–1.00)
GFR, EST AFRICAN AMERICAN: 56 mL/min — AB (ref >60)
GFR, EST NON AFRICAN AMERICAN: 48 mL/min — AB (ref >60)
Glucose, Bld: 136 mg/dL — ABNORMAL HIGH (ref 65–99)
POTASSIUM: 3.2 mmol/L — AB (ref 3.5–5.1)
SODIUM: 139 mmol/L (ref 135–145)

## 2015-01-29 MED ORDER — DEXAMETHASONE SODIUM PHOSPHATE 10 MG/ML IJ SOLN
10.0000 mg | Freq: Once | INTRAMUSCULAR | Status: AC
  Administered 2015-01-29: 10 mg via INTRAVENOUS
  Filled 2015-01-29: qty 1

## 2015-01-29 MED ORDER — DIPHENHYDRAMINE HCL 50 MG/ML IJ SOLN
25.0000 mg | Freq: Once | INTRAMUSCULAR | Status: AC
  Administered 2015-01-29: 25 mg via INTRAVENOUS
  Filled 2015-01-29: qty 1

## 2015-01-29 MED ORDER — KETOROLAC TROMETHAMINE 30 MG/ML IJ SOLN
30.0000 mg | Freq: Once | INTRAMUSCULAR | Status: AC
  Administered 2015-01-29: 30 mg via INTRAVENOUS
  Filled 2015-01-29: qty 1

## 2015-01-29 MED ORDER — METOCLOPRAMIDE HCL 5 MG/ML IJ SOLN
10.0000 mg | Freq: Once | INTRAMUSCULAR | Status: AC
  Administered 2015-01-29: 10 mg via INTRAVENOUS
  Filled 2015-01-29: qty 2

## 2015-01-29 NOTE — ED Provider Notes (Signed)
CSN: [id number]     Arrival date & time 01/29/15  1409 History   First MD Initiated Contact with Patient 01/29/15 1902     Chief Complaint  Patient presents with  . Headache     (Consider location/radiation/quality/duration/timing/severity/associated sxs/prior Treatment) Patient is a 59 y.o. female presenting with headaches.  Headache Pain location:  L parietal Quality:  Dull Radiates to:  Does not radiate Onset quality:  Gradual Duration:  1 day Timing:  Constant Progression:  Unchanged Chronicity:  Chronic Similar to prior headaches: no   Context comment:  Fell 5 months ago, increased ha since that time Relieved by:  Nothing Worsened by:  Activity, light and sound Associated symptoms: nausea   Associated symptoms: no fever, no loss of balance, no neck stiffness, no visual change and no vomiting     Past Medical History  Diagnosis Date  . Seizures   . Allergy   . Depression   . Anxiety   . Hypertension    Past Surgical History  Procedure Laterality Date  . Cesarean section    . Tubal ligation     Family History  Problem Relation Age of Onset  . Cancer Mother     lung  . Cancer Maternal Aunt     breast  . Cancer Paternal Aunt     breast  . Heart disease Father   . Gout Son   . Heart disease Maternal Grandmother    Social History  Substance Use Topics  . Smoking status: Never Smoker   . Smokeless tobacco: None  . Alcohol Use: Yes     Comment: 1-2/month   OB History    No data available     Review of Systems  Constitutional: Negative for fever.  Gastrointestinal: Positive for nausea. Negative for vomiting.  Musculoskeletal: Negative for neck stiffness.  Neurological: Positive for headaches. Negative for loss of balance.  All other systems reviewed and are negative.     Allergies  Aspirin and Vicodin  Home Medications   Prior to Admission medications   Medication Sig Start Date End Date Taking? Authorizing Provider  clonazePAM (KLONOPIN) 1  MG tablet Take 1 mg by mouth daily as needed for anxiety.   Yes Historical Provider, MD  fluticasone (FLONASE) 50 MCG/ACT nasal spray Place 1 spray into both nostrils daily.   Yes Historical Provider, MD  lamoTRIgine (LAMICTAL) 100 MG tablet Take 100 mg by mouth 2 (two) times daily. i pill in am and 2 pills in pm   Yes Historical Provider, MD  losartan (COZAAR) 100 MG tablet Take 100 mg by mouth daily.   Yes Historical Provider, MD  metoprolol (LOPRESSOR) 50 MG tablet Take 50 mg by mouth 2 (two) times daily.   Yes Historical Provider, MD  ranitidine (ZANTAC) 150 MG tablet Take 150 mg by mouth 2 (two) times daily.   Yes Historical Provider, MD  rizatriptan (MAXALT-MLT) 10 MG disintegrating tablet Take 10 mg by mouth daily as needed for migraine. May repeat in 2 hours if needed for migraine   Yes Historical Provider, MD  traMADol (ULTRAM) 50 MG tablet Take 50 mg by mouth daily as needed for moderate pain.    Yes Historical Provider, MD  venlafaxine XR (EFFEXOR-XR) 75 MG 24 hr capsule Take 75 mg by mouth daily with breakfast.   Yes Historical Provider, MD  zolpidem (AMBIEN) 10 MG tablet Take 10 mg by mouth at bedtime as needed for sleep.   Yes Historical Provider, MD  methylPREDNISolone (MEDROL) 4  MG tablet Take 2 tablets (8 mg total) by mouth daily. Patient not taking: Reported on 01/29/2015 09/07/12   Elvina Sidle, MD  oxyCODONE-acetaminophen (PERCOCET/ROXICET) 5-325 MG per tablet Take 1-2 tablets by mouth every 6 (six) hours as needed. Patient not taking: Reported on 01/29/2015 08/30/14   Linwood Dibbles, MD   BP 129/70 mmHg  Pulse 79  Temp(Src) 98.6 F (37 C) (Oral)  Resp 16  SpO2 98% Physical Exam  Constitutional: She is oriented to person, place, and time. She appears well-developed and well-nourished.  HENT:  Head: Normocephalic and atraumatic.  Right Ear: External ear normal.  Left Ear: External ear normal.  Eyes: Conjunctivae and EOM are normal. Pupils are equal, round, and reactive to  light.  Neck: Normal range of motion. Neck supple.  Cardiovascular: Normal rate, regular rhythm, normal heart sounds and intact distal pulses.   Pulmonary/Chest: Effort normal and breath sounds normal.  Abdominal: Soft. Bowel sounds are normal. There is no tenderness.  Musculoskeletal: Normal range of motion.  Neurological: She is alert and oriented to person, place, and time. She has normal strength and normal reflexes. No cranial nerve deficit or sensory deficit. Gait normal.  Skin: Skin is warm and dry.  Vitals reviewed.   ED Course  Procedures (including critical care time) Labs Review Labs Reviewed  BASIC METABOLIC PANEL - Abnormal; Notable for the following:    Potassium 3.2 (*)    Glucose, Bld 136 (*)    Creatinine, Ser 1.21 (*)    GFR calc non Af Amer 48 (*)    GFR calc Af Amer 56 (*)    All other components within normal limits  CBC WITH DIFFERENTIAL/PLATELET  URINALYSIS, ROUTINE W REFLEX MICROSCOPIC (NOT AT [facility])    Imaging Review Ct Head Wo Contrast  01/29/2015   CLINICAL DATA:  Pt sts left sided HA x 4 days with some tingling in head and face; pt sts hx of head injury with headaches since then; pt sts colors and sounds intensify pain. Pt states she takes Maxalt for migraines. Previous concussion April 2016.  EXAM: CT HEAD WITHOUT CONTRAST  TECHNIQUE: Contiguous axial images were obtained from the base of the skull through the vertex without intravenous contrast.  COMPARISON:  08/30/2014  FINDINGS: Atherosclerotic and physiologic intracranial calcifications. Incomplete posterior arch of C1, an anatomic variant, stable. Mild atrophy. There is no evidence of acute intracranial hemorrhage, brain edema, mass lesion, acute infarction, mass effect, or midline shift. Acute infarct may be inapparent on noncontrast CT. No other intra-axial abnormalities are seen, and the ventricles and sulci are within normal limits in size and symmetry. No abnormal extra-axial fluid collections or  masses are identified. No significant calvarial abnormality.  IMPRESSION: 1. Negative for bleed or other acute intracranial process.   Electronically Signed   By: Corlis Leak M.D.   On: 01/29/2015 20:46   I have personally reviewed and evaluated these images and lab results as part of my medical decision-making.   EKG Interpretation None      MDM   Final diagnoses:  Other migraine without status migrainosus, not intractable    59 y.o. female with pertinent PMH of migraine presents with acute on chronic ha in context of head injury months ago.  States ha today is different than prior.  No concerning historical features for [facility], however as ha is different and with prior trauma, obtained head CT which was unremarkable.  No signs of CVA or other problems on wu or exam.  Likely recurrent  migraine.  HA relieved with reglan, benadryl, decadron, and toradol.  DC home in stable condition.    I have reviewed all laboratory and imaging studies if ordered as above  1. Other migraine without status migrainosus, not intractable         Mirian Mo, MD 01/30/15 [phone]

## 2015-01-29 NOTE — ED Notes (Signed)
Pt made aware of need for urine 

## 2015-01-29 NOTE — ED Notes (Signed)
Pt sts left sided HA x 4 days with some tingling in head and face; pt sts hx of head injury with headaches since then; pt sts colors and sounds intensify pain

## 2015-01-30 NOTE — ED Notes (Signed)
Pt able to dress independently and ambulate to wheelchair.  Gait steady and even.   

## 2015-01-30 NOTE — Discharge Instructions (Signed)

## 2015-02-25 ENCOUNTER — Encounter: Payer: Self-pay | Admitting: Neurology

## 2015-02-25 ENCOUNTER — Ambulatory Visit (INDEPENDENT_AMBULATORY_CARE_PROVIDER_SITE_OTHER): Payer: Commercial Managed Care - HMO | Admitting: Neurology

## 2015-02-25 VITALS — BP 138/80 | HR 87 | Ht 63.0 in | Wt 212.8 lb

## 2015-02-25 DIAGNOSIS — F0781 Postconcussional syndrome: Secondary | ICD-10-CM | POA: Diagnosis not present

## 2015-02-25 DIAGNOSIS — R51 Headache: Secondary | ICD-10-CM | POA: Diagnosis not present

## 2015-02-25 MED ORDER — NORTRIPTYLINE HCL 25 MG PO CAPS: 25.0000 mg | ORAL_CAPSULE | Freq: Every day | ORAL | Status: DC

## 2015-02-25 MED ORDER — VENLAFAXINE HCL ER 150 MG PO CP24: 150.0000 mg | ORAL_CAPSULE | Freq: Every day | ORAL | Status: DC
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
## 2015-02-25 NOTE — Patient Instructions (Addendum)
Remember to drink plenty of fluid, eat healthy meals and do not skip any meals. Try to eat protein with a every meal and eat a healthy snack such as fruit or nuts in between meals. Try to keep a regular sleep-wake schedule and try to exercise daily, particularly in the form of walking, 20-30 minutes a day, if you can.   As far as your medications are concerned, I would like to suggest:  Nortriptyline 25mg  at night Increase Venlafaxine to 150mg  daily  As far as diagnostic testing: EKG today  I would like to see you back in 3 months, sooner if we need to. Please call us with any interim questions, concerns, problems, updates or refill requests.   Our phone number is [phone]. We also have an after hours call service for urgent matters and there is a physician on-call for urgent questions. For any emergencies you know to call 911 or go to the nearest emergency room

## 2015-02-26 DIAGNOSIS — F0781 Postconcussional syndrome: Secondary | ICD-10-CM | POA: Insufficient documentation

## 2015-03-02 ENCOUNTER — Ambulatory Visit (INDEPENDENT_AMBULATORY_CARE_PROVIDER_SITE_OTHER): Payer: Commercial Managed Care - HMO | Admitting: Internal Medicine

## 2015-03-02 VITALS — BP 134/84 | HR 114 | Temp 99.5°F | Resp 17 | Ht 63.5 in | Wt 207.0 lb

## 2015-03-02 DIAGNOSIS — A084 Viral intestinal infection, unspecified: Secondary | ICD-10-CM | POA: Diagnosis not present
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
## 2015-04-22 ENCOUNTER — Other Ambulatory Visit: Payer: Self-pay | Admitting: Internal Medicine

## 2015-04-22 DIAGNOSIS — R42 Dizziness and giddiness: Secondary | ICD-10-CM

## 2015-05-02 ENCOUNTER — Ambulatory Visit
Admission: RE | Admit: 2015-05-02 | Discharge: 2015-05-02 | Disposition: A | Discharge status: Home / Self Care | Payer: Commercial Managed Care - HMO | Source: Ambulatory Visit | Attending: Internal Medicine | Admitting: Internal Medicine

## 2015-05-02 DIAGNOSIS — R42 Dizziness and giddiness: Secondary | ICD-10-CM

## 2015-05-02 MED ORDER — GADOBENATE DIMEGLUMINE 529 MG/ML IV SOLN
20.0000 mL | Freq: Once | INTRAVENOUS | Status: AC | PRN
  Administered 2015-05-02: 20 mL via INTRAVENOUS

## 2015-06-03 ENCOUNTER — Encounter: Payer: Self-pay | Admitting: Neurology

## 2015-06-03 ENCOUNTER — Ambulatory Visit (INDEPENDENT_AMBULATORY_CARE_PROVIDER_SITE_OTHER): Payer: Commercial Managed Care - HMO | Admitting: Neurology

## 2015-06-03 VITALS — BP 131/84 | HR 83 | Ht 63.5 in | Wt 209.0 lb

## 2015-06-03 DIAGNOSIS — R42 Dizziness and giddiness: Secondary | ICD-10-CM

## 2015-06-03 MED ORDER — NORTRIPTYLINE HCL 25 MG PO CAPS: 50.0000 mg | ORAL_CAPSULE | Freq: Every day | ORAL | Status: DC

## 2015-06-03 MED ORDER — VENLAFAXINE HCL ER 75 MG PO CP24: 75.0000 mg | ORAL_CAPSULE | Freq: Every day | ORAL | Status: DC

## 2015-06-03 MED ORDER — ONDANSETRON 4 MG PO TBDP: ORAL_TABLET | ORAL | Status: DC
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
## 2015-06-03 NOTE — Patient Instructions (Signed)
Remember to drink plenty of fluid, eat healthy meals and do not skip any meals. Try to eat protein with a every meal and eat a healthy snack such as fruit or nuts in between meals. Try to keep a regular sleep-wake schedule and try to exercise daily, particularly in the form of walking, 20-30 minutes a day, if you can.   As far as your medications are concerned, I would like to suggest;  Decrease Effexor back to  daily Increase Nortriptyline to  at night Zofran at onset of headache or dizziness. Can take it with the maxalt  I would like to see you back in 4 months, sooner if we need to. Please call us with any interim questions, concerns, problems, updates or refill requests.   Our phone number is [phone]. We also have an after hours call service for urgent matters and there is a physician on-call for urgent questions. For any emergencies you know to call 911 or go to the nearest emergency room

## 2015-06-07 ENCOUNTER — Telehealth: Payer: Self-pay | Admitting: Neurology

## 2015-06-07 ENCOUNTER — Encounter: Payer: Self-pay | Admitting: *Deleted

## 2015-06-07 ENCOUNTER — Encounter: Payer: Self-pay | Admitting: Neurology

## 2015-06-07 NOTE — Telephone Encounter (Signed)
Faxed letter to requested number. Received confirmation. Called pt and informed. She verbalized understanding.

## 2015-06-07 NOTE — Telephone Encounter (Addendum)
Called pt back. Advised Dr ahern seeing pt. Pt states headache "6-7/10" on pain scale. She tried taking ultram  x1 along with Maxalt as prescribed. Did not take zofran because insurance would not cover medication and it would cost her 80 dollars. She did not pick up from pharmacy. She increased nortriptyline tLucia Gaskins  at night and decreased effexor back to  as advised by Dr Lucia Gaskins. Denied nausea/dizziness. Told her I will speak to Dr Lucia Gaskins and call her back to advise. She verbalized understanding.

## 2015-06-07 NOTE — Telephone Encounter (Signed)
Pt would like work letter writing her out of work for today Fax: [phone] ATTN : Delila Spence

## 2015-06-07 NOTE — Telephone Encounter (Signed)
Per Dr Lucia Gaskins- pt needs to give nortriptyline 3-4 weeks to stat working. She increased medication to . She needs to give medication time to start working.

## 2015-06-07 NOTE — Telephone Encounter (Signed)
Patient called, states she has had a migraine off and on since last visit, is taking rizatriptan (MAXALT-MLT) 10 MG disintegrating tablet however headache returns, any suggestions?, requests out of work note for today.

## 2015-06-10 ENCOUNTER — Telehealth: Payer: Self-pay | Admitting: *Deleted

## 2015-06-10 NOTE — Telephone Encounter (Signed)
Called pt. Clarified where she wanted her prescriptions sent for the future. She would like to use [facility] and not [facility]. Fixed this is pt chart. Advised I will send new request to [facility] was approved by Dr Lucia Gaskins. She verbalized understanding.

## 2015-06-11 NOTE — Telephone Encounter (Signed)
Called [facility] and cx rx nortriptyline/zofran. Pt requested rx's be send to [facility] instead. Spoke to [employer]. She cancelled both.

## 2015-06-11 NOTE — Telephone Encounter (Signed)
Faxed requested rx nortriptyline/zofran to [facility] per pt. Received confirmation.

## 2015-06-11 NOTE — Telephone Encounter (Signed)
Faxed back okay to [facility] per Dr Lucia Gaskins to continue treatment for pt to take nortriptyline and tramadol together. Fax: [phone]. Received confirmation.

## 2015-08-14 ENCOUNTER — Ambulatory Visit (INDEPENDENT_AMBULATORY_CARE_PROVIDER_SITE_OTHER): Payer: Commercial Managed Care - HMO | Admitting: Family Medicine

## 2015-08-14 VITALS — BP 124/86 | HR 86 | Temp 98.1°F | Resp 18 | Ht 63.5 in | Wt 204.8 lb

## 2015-08-14 DIAGNOSIS — R0981 Nasal congestion: Secondary | ICD-10-CM | POA: Diagnosis not present

## 2015-08-14 MED ORDER — AMOXICILLIN-POT CLAVULANATE 875-125 MG PO TABS
1.0000 | ORAL_TABLET | Freq: Two times a day (BID) | ORAL | Status: DC
Start: 2015-08-14 — End: 2015-08-21

## 2015-08-14 NOTE — Patient Instructions (Addendum)

## 2015-08-14 NOTE — Progress Notes (Signed)
Patient ID: Mallory Mills MRN: [id number], DOB: [birth year], 60 y.o. Date of Encounter: 08/14/2015, 6:44 PM  Primary Physician: Pearson GrippeJames Kim, MD  Chief Complaint:  Chief Complaint  Patient presents with  . Sinusitis    x1 day    HPI: 60 y.o. year old female presents with 1 day history of nasal congestion, post nasal drip, sore throat, sinus pressure, and cough. Afebrile. No chills. Nasal congestion thick and green/yellow. Sinus pressure is the worst symptom. Cough is productive secondary to post nasal drip and not associated with time of day. Ears feel full, leading to sensation of muffled hearing. Has tried OTC cold preps without success. No GI complaints.   No recent antibiotics, recent travels, vomiting, or sick contacts   No leg trauma, sedentary periods, h/o cancer, or tobacco use.  Past Medical History  Diagnosis Date  . Seizures (HCC)   . Allergy   . Depression   . Anxiety   . Hypertension   . Migraine   . Sleep apnea   . Kidney stones 2005     Home Meds: Prior to Admission medications   Medication Sig Start Date End Date Taking? Authorizing Provider  clonazePAM (KLONOPIN) 1 MG tablet Take 1 mg by mouth daily as needed for anxiety.   Yes Historical Provider, MD  Fexofenadine HCl (ALLEGRA PO) Take 1 tablet by mouth daily.   Yes Historical Provider, MD  fluticasone (FLONASE) 50 MCG/ACT nasal spray Place 1 spray into both nostrils daily.   Yes Historical Provider, MD  lamoTRIgine (LAMICTAL) 100 MG tablet Take 100 mg by mouth 2 (two) times daily. i pill in am and 2 pills in pm   Yes Historical Provider, MD  losartan (COZAAR) 100 MG tablet Take 100 mg by mouth daily.   Yes Historical Provider, MD  metoprolol (LOPRESSOR) 50 MG tablet Take 50 mg by mouth 2 (two) times daily.   Yes Historical Provider, MD  Multiple Vitamins-Minerals (MULTIVITAMIN ADULT PO) Take 1 tablet by mouth daily.   Yes Historical Provider, MD  Multiple Vitamins-Minerals (OCUVITE EYE + MULTI PO) Take 1  drop by mouth as needed.   Yes Historical Provider, MD  nortriptyline (PAMELOR) 25 MG capsule Take 2 capsules (50 mg total) by mouth at bedtime. 06/03/15  Yes Anson FretAntonia B Ahern, MD  ondansetron (ZOFRAN ODT) 4 MG disintegrating tablet Take 1-2 at the onset of headache or dizziness. May repeat in 2 hours. May take with maxalt or ultram. 06/03/15  Yes Anson FretAntonia B Ahern, MD  ranitidine (ZANTAC) 150 MG tablet Take 150 mg by mouth 2 (two) times daily.   Yes Historical Provider, MD  rizatriptan (MAXALT-MLT) 10 MG disintegrating tablet Take 10 mg by mouth daily as needed for migraine. May repeat in 2 hours if needed for migraine   Yes Historical Provider, MD  traMADol (ULTRAM) 50 MG tablet Take 50 mg by mouth every 6 (six) hours as needed.   Yes Historical Provider, MD  venlafaxine XR (EFFEXOR XR) 75 MG 24 hr capsule Take 1 capsule (75 mg total) by mouth daily with breakfast. 06/03/15  Yes Anson FretAntonia B Ahern, MD  zolpidem (AMBIEN) 10 MG tablet Take 10 mg by mouth at bedtime as needed for sleep.   Yes Historical Provider, MD    Allergies:  Allergies  Allergen Reactions  . Aspirin     Itching   . Vicodin [Hydrocodone-Acetaminophen] Itching    Social History   Social History  . Marital Status: Divorced    Spouse Name: N/A  .  Number of Children: 2  . Years of Education: 12+   Occupational History  . Administration Unemployed  . [employer]     Social History Main Topics  . Smoking status: Never Smoker   . Smokeless tobacco: Not on file  . Alcohol Use: Yes     Comment: 1-2/month  . Drug Use: No  . Sexual Activity: Not on file   Other Topics Concern  . Not on file   Social History Narrative   Lives at home with brother   Caffeine use: 1 coffee or soft drink per day     Review of Systems: Constitutional: negative for chills, fever, night sweats or weight changes Cardiovascular: negative for chest pain or palpitations Respiratory: negative for hemoptysis, wheezing, or shortness of  breath Abdominal: negative for abdominal pain, nausea, vomiting or diarrhea Dermatological: negative for rash Neurologic: negative for headache   Physical Exam: Blood pressure 124/86, pulse 86, temperature 98.1 F (36.7 C), temperature source Oral, resp. rate 18, height 5' 3.5" (1.613 m), weight 204 lb 12.8 oz (92.897 kg), SpO2 96 %., Body mass index is 35.71 kg/(m^2). General: Well developed, well nourished, in no acute distress. Head: Normocephalic, atraumatic, eyes without discharge, sclera non-icteric, nares are congested. Bilateral auditory canals clear, TM's are without perforation, pearly grey with reflective cone of light bilaterally. Serous effusion bilaterally behind TM's. Maxillary sinus TTP. Oral cavity moist, dentition normal. Posterior pharynx with post nasal drip and mild erythema. No peritonsillar abscess or tonsillar exudate. Neck: Supple. No thyromegaly. Full ROM. No lymphadenopathy. Lungs: Clear bilaterally to auscultation without wheezes, rales, or rhonchi. Breathing is unlabored.  Heart: RRR with S1 S2. No murmurs, rubs, or gallops appreciated. Msk:  Strength and tone normal for age. Extremities: No clubbing or cyanosis. No edema. Neuro: Alert and oriented X 3. Moves all extremities spontaneously. CNII-XII grossly in tact. Psych:  Responds to questions appropriately with a normal affect.   Labs:   ASSESSMENT AND PLAN:  60 y.o. year old female with sinusitis -   ICD-9-CM ICD-10-CM   1. Sinus congestion 478.19 R09.81 amoxicillin-clavulanate (AUGMENTIN) 875-125 MG tablet    -Tylenol/Motrin prn -Rest/fluids -RTC precautions -RTC 3-5 days if no improvement  Signed, Elvina Sidle, MD 08/14/2015 6:44 PM

## 2015-08-15 ENCOUNTER — Telehealth: Payer: Self-pay

## 2015-08-15 NOTE — Telephone Encounter (Signed)
Patient called back about getting her work note. She would like to pick it up soon as possible.

## 2015-08-15 NOTE — Telephone Encounter (Signed)
Pt is needing to have a work note extended thru today and returning on Friday   Best number [phone]

## 2015-08-16 ENCOUNTER — Encounter: Payer: Self-pay | Admitting: *Deleted

## 2015-08-16 NOTE — Telephone Encounter (Signed)
Pt picked up note already.

## 2015-08-21 ENCOUNTER — Ambulatory Visit (INDEPENDENT_AMBULATORY_CARE_PROVIDER_SITE_OTHER): Payer: Commercial Managed Care - HMO | Admitting: Family Medicine

## 2015-08-21 VITALS — BP 132/84 | HR 118 | Temp 97.4°F | Resp 18 | Ht 63.5 in | Wt 206.0 lb

## 2015-08-21 DIAGNOSIS — R197 Diarrhea, unspecified: Secondary | ICD-10-CM

## 2015-08-21 MED ORDER — METRONIDAZOLE 250 MG PO TABS: 250.0000 mg | ORAL_TABLET | Freq: Three times a day (TID) | ORAL | Status: DC

## 2015-08-21 NOTE — Progress Notes (Signed)
This is a 60 year old woman who presents with 2 and half days of progressive diarrhea, abdominal cramps, no blood per rectum, no vomiting, and possible low-grade fever.  She's been started on Augmentin for a bad sinus infection last week. She's never had this kind of diarrhea before. She's been taking Imodium without benefit over the last 24 hours.  Objective:BP 132/84 mmHg  Pulse 118  Temp(Src) 97.4 F (36.3 C) (Oral)  Resp 18  Ht 5' 3.5" (1.613 m)  Wt 206 lb (93.441 kg)  BMI 35.91 kg/m2  SpO2 96% HEENT: Unremarkable Patient's in no acute distress  Diarrhea, unspecified type - Plan: metroNIDAZOLE (FLAGYL) 250 MG tablet  Elvina SidleKurt Galya Dunnigan M.D.

## 2015-08-21 NOTE — Patient Instructions (Addendum)
Please call if the diarrhea does not start to subside in the next 24 hours.   Take a probiotic as well as the Flagyl

## 2015-08-29 ENCOUNTER — Ambulatory Visit (INDEPENDENT_AMBULATORY_CARE_PROVIDER_SITE_OTHER): Payer: Commercial Managed Care - HMO | Admitting: Physician Assistant

## 2015-08-29 VITALS — BP 136/84 | HR 89 | Temp 98.2°F | Resp 18 | Ht 63.0 in | Wt 208.0 lb

## 2015-08-29 DIAGNOSIS — G43109 Migraine with aura, not intractable, without status migrainosus: Secondary | ICD-10-CM | POA: Diagnosis not present

## 2015-08-29 MED ORDER — KETOROLAC TROMETHAMINE 60 MG/2ML IM SOLN
60.0000 mg | Freq: Once | INTRAMUSCULAR | Status: AC
  Administered 2015-08-29: 60 mg via INTRAMUSCULAR

## 2015-08-29 NOTE — Progress Notes (Signed)
[facility] [address] [place] [address] [phone]  Date:  08/29/2015   Name:  Mallory Mills   DOB:  19-Jul-1955   MRN:  [id number]  PCP:  Pearson Grippe, MD    Chief Complaint: Headache   History of Present Illness:  This is a 60 y.o. female with PMH migraines, allergic rhinitis, HTN, GERD, insomnia who is presenting with headache x 22 hours ago. Getting worse. She states didn't seem to be as bad as her migraines but feels it may be heading in that direction. Located in frontal forehead, bilateral. Feels like pressure. Has had some sensitivity to sound. No photophobia. Mild nausea, no vomiting. No blurred vision. Took tylenol and tramadol and helped some but comes back after wears off. No longer having any congestion or sinus pressure. Denies otalgia, fever, chills. She has maxalt at home for migraines which helps a lot but has not taken because didn't think this was a true migraine yet. States she gets about 10 migraines a month. A typical migraine is either one sided or frontal bilateral, she will typically get an aura before, typically gets photo and phonophobic, gets nauseated.  Will sometimes take ibuprofen for migraines but has not taken any nsaids today.  Pt was seen here 2 weeks ago and treated for sinus infection with augmentin. She returned 1 week ago with diarrhea d/t the augmentin. She was placed on metronidazole for the diarrhea. Diarrhea resolved and sinus pressure/congestion 100% improved. She does have a hx of allergies -- takes flonase and claritin daily.  Review of Systems:  Review of Systems See HPI  Patient Active Problem List   Diagnosis Date Noted  . Post concussive syndrome 02/26/2015  . Chronic headaches 09/07/2012  . Palpitations 09/07/2012    Prior to Admission medications   Medication Sig Start Date End Date Taking? Authorizing Provider  clonazePAM (KLONOPIN) 1 MG tablet Take 1 mg by mouth daily as needed for anxiety.   Yes  Historical Provider, MD  Fexofenadine HCl (ALLEGRA PO) Take 1 tablet by mouth daily.   Yes Historical Provider, MD  fluticasone (FLONASE) 50 MCG/ACT nasal spray Place 1 spray into both nostrils daily.   Yes Historical Provider, MD  lamoTRIgine (LAMICTAL) 100 MG tablet Take 100 mg by mouth 2 (two) times daily. i pill in am and 2 pills in pm   Yes Historical Provider, MD  losartan (COZAAR) 100 MG tablet Take 100 mg by mouth daily.   Yes Historical Provider, MD  metoprolol (LOPRESSOR) 50 MG tablet Take 50 mg by mouth 2 (two) times daily.   Yes Historical Provider, MD  Multiple Vitamins-Minerals (MULTIVITAMIN ADULT PO) Take 1 tablet by mouth daily.   Yes Historical Provider, MD  Multiple Vitamins-Minerals (OCUVITE EYE + MULTI PO) Take 1 drop by mouth as needed.   Yes Historical Provider, MD  nortriptyline (PAMELOR) 25 MG capsule Take 2 capsules (50 mg total) by mouth at bedtime. 06/03/15  Yes Anson Fret, MD  ondansetron (ZOFRAN ODT) 4 MG disintegrating tablet Take 1-2 at the onset of headache or dizziness. May repeat in 2 hours. May take with maxalt or ultram. 06/03/15  Yes Anson Fret, MD  ranitidine (ZANTAC) 150 MG tablet Take 150 mg by mouth 2 (two) times daily.   Yes Historical Provider, MD  rizatriptan (MAXALT-MLT) 10 MG disintegrating tablet Take 10 mg by mouth daily as needed for migraine. May repeat in 2 hours if needed for migraine   Yes  Historical Provider, MD  traMADol (ULTRAM) 50 MG tablet Take 50 mg by mouth every 6 (six) hours as needed.   Yes Historical Provider, MD  venlafaxine XR (EFFEXOR XR) 75 MG 24 hr capsule Take 1 capsule (75 mg total) by mouth daily with breakfast. 06/03/15  Yes Anson FretAntonia B Ahern, MD  zolpidem (AMBIEN) 10 MG tablet Take 10 mg by mouth at bedtime as needed for sleep.   Yes Historical Provider, MD           Allergies  Allergen Reactions  . Aspirin     Itching   . Augmentin [Amoxicillin-Pot Clavulanate] Diarrhea  . Vicodin [Hydrocodone-Acetaminophen]  Itching    Past Surgical History  Procedure Laterality Date  . Cesarean section  1978 & 1982    x2  . Tubal ligation  1982  . Appendectomy  1982  . Knee surgery  12/04/14    Social History  Substance Use Topics  . Smoking status: Never Smoker   . Smokeless tobacco: None  . Alcohol Use: Yes     Comment: 1-2/month    Family History  Problem Relation Age of Onset  . Cancer Mother     lung  . Cancer Maternal Aunt     breast  . Cancer Paternal Aunt     breast  . Heart disease Father   . Gout Son   . Heart disease Maternal Grandmother   . Hypertension Mother   . Hypertension Father     Medication list has been reviewed and updated.  Physical Examination:  Physical Exam  Constitutional: She is oriented to person, place, and time. She appears well-developed and well-nourished. No distress.  HENT:  Head: Normocephalic and atraumatic.  Right Ear: Hearing, external ear and ear canal normal.  Left Ear: Hearing, external ear and ear canal normal.  Nose: Right sinus exhibits frontal sinus tenderness (mild). Right sinus exhibits no maxillary sinus tenderness. Left sinus exhibits frontal sinus tenderness (mild). Left sinus exhibits no maxillary sinus tenderness.  Mouth/Throat: Uvula is midline, oropharynx is clear and moist and mucous membranes are normal.  TMs partially obstructed by cerumen bilaterally.  Eyes: Conjunctivae, EOM and lids are normal. Pupils are equal, round, and reactive to light. Right eye exhibits no discharge. Left eye exhibits no discharge. No scleral icterus.  Mild photophobia on exam  Cardiovascular: Normal rate, regular rhythm, normal heart sounds and normal pulses.   No murmur heard. Pulmonary/Chest: Effort normal and breath sounds normal. No respiratory distress. She has no wheezes. She has no rhonchi. She has no rales.  Musculoskeletal: Normal range of motion.  Lymphadenopathy:       Head (right side): No submental, no submandibular and no tonsillar  adenopathy present.       Head (left side): No submental, no submandibular and no tonsillar adenopathy present.    She has no cervical adenopathy.  Neurological: She is alert and oriented to person, place, and time. She has normal strength and normal reflexes. No cranial nerve deficit or sensory deficit. Gait normal.  Skin: Skin is warm, dry and intact. No lesion and no rash noted.  Psychiatric: She has a normal mood and affect. Her speech is normal and behavior is normal. Thought content normal.   BP 136/84 mmHg  Pulse 89  Temp(Src) 98.2 F (36.8 C)  Resp 18  Ht 5\' 3"  (1.6 m)  Wt 208 lb (94.348 kg)  BMI 36.85 kg/m2  SpO2 96%  Assessment and Plan:  1. Migraine with aura and without status migrainosus,  not intractable Likely headache that is progressing to migraine. Neuro exam normal. Gave toradol 60 mg injection in office today. She will go home and take a maxalt. Return in 24 hours if symptoms still not improving. - ketorolac (TORADOL) injection 60 mg; Inject 2 mLs (60 mg total) into the muscle once.   [place] Brackett, MHS [facility]  08/29/2015

## 2015-08-29 NOTE — Patient Instructions (Addendum)
You were given an injection of toradol today. Do not take any other products containing ibuprofen, naprosyn or aspirin today. You may use tylenol with this medication. Take maxalt when you get home.  Return if not getting better in 24 hours.   IF you received an x-ray today, you will receive an invoice from [facility]. Please contact [facility] at [phone] with questions or concerns regarding your invoice.   IF you received labwork today, you will receive an invoice from [facility]. Please contact [facility] at [phone] with questions or concerns regarding your invoice.   Our billing staff will not be able to assist you with questions regarding bills from these companies.  You will be contacted with the lab results as soon as they are available. The fastest way to get your results is to activate your My Chart account. Instructions are located on the last page of this paperwork. If you have not heard from us regarding the results in 2 weeks, please contact this office.

## 2015-09-10 ENCOUNTER — Ambulatory Visit: Payer: Commercial Managed Care - HMO | Admitting: Neurology

## 2015-09-10 ENCOUNTER — Telehealth: Payer: Self-pay | Admitting: Neurology

## 2015-09-11 ENCOUNTER — Telehealth: Payer: Self-pay | Admitting: Neurology

## 2015-09-11 NOTE — Telephone Encounter (Signed)
Pt called sts she will not have insurance after 09/23/15 and is wanting to move her appt up from 09/25/15. Dr Lucia GaskinsAhern is booked, can you work her in?

## 2015-09-11 NOTE — Telephone Encounter (Signed)
Dr Lucia GaskinsAhern- are you ok to fit her in somewhere?

## 2015-09-12 NOTE — Telephone Encounter (Signed)
Sure, squeeze her in somewhere but please dont use a new patient slot. I can create an appointment if needed thanks

## 2015-09-12 NOTE — Telephone Encounter (Signed)
Called pt. Made erlier f/u per Pt request in 09/17/15. Ok to fit pt in per Dr Lucia GaskinsAhern. Check in 345pm. R/s from 5/17. ELK 09/12/15

## 2015-09-17 ENCOUNTER — Ambulatory Visit (INDEPENDENT_AMBULATORY_CARE_PROVIDER_SITE_OTHER): Payer: Commercial Managed Care - HMO | Admitting: Neurology

## 2015-09-17 ENCOUNTER — Encounter: Payer: Self-pay | Admitting: Neurology

## 2015-09-17 VITALS — BP 162/91 | HR 89 | Ht 63.0 in | Wt 207.8 lb

## 2015-09-17 DIAGNOSIS — G43711 Chronic migraine without aura, intractable, with status migrainosus: Secondary | ICD-10-CM

## 2015-09-17 DIAGNOSIS — R42 Dizziness and giddiness: Secondary | ICD-10-CM

## 2015-09-17 MED ORDER — GABAPENTIN 300 MG PO CAPS: 600.0000 mg | ORAL_CAPSULE | Freq: Three times a day (TID) | ORAL | Status: DC

## 2015-09-17 NOTE — Patient Instructions (Signed)
Remember to drink plenty of fluid, eat healthy meals and do not skip any meals. Try to eat protein with a every meal and eat a healthy snack such as fruit or nuts in between meals. Try to keep a regular sleep-wake schedule and try to exercise daily, particularly in the form of walking, 20-30 minutes a day, if you can.   As far as your medications are concerned, I would like to suggest: Neurontin 600mg  three times a day as needed  I would like to see you back as needed, sooner if we need to. Please call us with any interim questions, concerns, problems, updates or refill requests.   Our phone number is [phone]. We also have an after hours call service for urgent matters and there is a physician on-call for urgent questions. For any emergencies you know to call 911 or go to the nearest emergency room
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
## 2015-09-18 DIAGNOSIS — G43719 Chronic migraine without aura, intractable, without status migrainosus: Secondary | ICD-10-CM | POA: Insufficient documentation

## 2015-09-25 ENCOUNTER — Ambulatory Visit: Payer: Commercial Managed Care - HMO | Admitting: Neurology

## 2015-11-11 ENCOUNTER — Telehealth: Payer: Self-pay | Admitting: *Deleted

## 2015-11-11 NOTE — Telephone Encounter (Signed)
[facility] attn Lauren Dredger.

## 2015-11-26 ENCOUNTER — Telehealth: Payer: Self-pay | Admitting: Neurology
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
## 2015-11-26 NOTE — Telephone Encounter (Signed)
Please advise that sudden onset of slurring of speech can be the sign of a TIA or stroke. If she has more dizziness, that can be from a number of things, including dehydration or drop in blood pressure. I would recommend that she check with her primary care physician and may need to see a PCP.

## 2015-11-27 NOTE — Telephone Encounter (Signed)
Patient states that she should be able to take ASA 81mg . Voiced understanding in directions. She is willing to proceed with new MRI but is concerned about the cost since she has new insurance.

## 2015-11-27 NOTE — Telephone Encounter (Signed)
Mallory MossesDiana, I recommend a new MRI of the brain if she is willing would you ask her please and let me know, I can order it from here? Also is she on aspirin or any other anti-platelet? If not, and there are no contraindications or allergies, ask her to take a baby aspirin until she sees me thanks.

## 2015-11-27 NOTE — Telephone Encounter (Signed)
Dr. Lucia GaskinsAhern, I spoke with patient again this morning. She is aware of recommendations below. At this time she feels like she can wait until August to see you, but will follow directions below if she has another episode. I advised her that I will send this message to you just so you are aware. Patient thanked us and looks forward to seeing us soon.

## 2015-11-30 NOTE — Telephone Encounter (Signed)
Mallory Mills, we can discuss it at our appointment on the 15th then. As long as she takes asa 81mg  until then daily. Let her know, thanks

## 2015-12-02 NOTE — Telephone Encounter (Signed)
LVM for pt relaying Dr Trevor Mace message below. Gave [facility] phone number if she has further questions.

## 2015-12-03 NOTE — Telephone Encounter (Signed)
error 

## 2015-12-13 ENCOUNTER — Emergency Department (HOSPITAL_COMMUNITY)
Admission: EM | Admit: 2015-12-13 | Discharge: 2015-12-13 | Disposition: A | Discharge status: Home / Self Care | Payer: BLUE CROSS/BLUE SHIELD | Attending: Emergency Medicine | Admitting: Emergency Medicine

## 2015-12-13 ENCOUNTER — Emergency Department (HOSPITAL_COMMUNITY): Payer: BLUE CROSS/BLUE SHIELD

## 2015-12-13 ENCOUNTER — Encounter (HOSPITAL_COMMUNITY): Payer: Self-pay | Admitting: Emergency Medicine

## 2015-12-13 DIAGNOSIS — W1839XA Other fall on same level, initial encounter: Secondary | ICD-10-CM | POA: Insufficient documentation

## 2015-12-13 DIAGNOSIS — Y939 Activity, unspecified: Secondary | ICD-10-CM | POA: Insufficient documentation

## 2015-12-13 DIAGNOSIS — Y999 Unspecified external cause status: Secondary | ICD-10-CM | POA: Diagnosis not present

## 2015-12-13 DIAGNOSIS — R42 Dizziness and giddiness: Secondary | ICD-10-CM | POA: Insufficient documentation

## 2015-12-13 DIAGNOSIS — I10 Essential (primary) hypertension: Secondary | ICD-10-CM | POA: Insufficient documentation

## 2015-12-13 DIAGNOSIS — M545 Low back pain: Secondary | ICD-10-CM | POA: Insufficient documentation

## 2015-12-13 DIAGNOSIS — Y9289 Other specified places as the place of occurrence of the external cause: Secondary | ICD-10-CM | POA: Diagnosis not present

## 2015-12-13 LAB — COMPREHENSIVE METABOLIC PANEL
ALT: 29 U/L (ref 14–54)
AST: 33 U/L (ref 15–41)
Albumin: 4.3 g/dL (ref 3.5–5.0)
Alkaline Phosphatase: 116 U/L (ref 38–126)
Anion gap: 11 (ref 5–15)
BUN: 10 mg/dL (ref 6–20)
CALCIUM: 10 mg/dL (ref 8.9–10.3)
CO2: 30 mmol/L (ref 22–32)
Chloride: 98 mmol/L — ABNORMAL LOW (ref 101–111)
Creatinine, Ser: 1.31 mg/dL — ABNORMAL HIGH (ref 0.44–1.00)
GFR, EST AFRICAN AMERICAN: 51 mL/min — AB (ref >60)
GFR, EST NON AFRICAN AMERICAN: 44 mL/min — AB (ref >60)
Glucose, Bld: 160 mg/dL — ABNORMAL HIGH (ref 65–99)
Potassium: 3.9 mmol/L (ref 3.5–5.1)
Sodium: 139 mmol/L (ref 135–145)
Total Bilirubin: 0.6 mg/dL (ref 0.3–1.2)
Total Protein: 7 g/dL (ref 6.5–8.1)

## 2015-12-13 LAB — CBC
HCT: 39.7 % (ref 36.0–46.0)
Hemoglobin: 12.8 g/dL (ref 12.0–15.0)
MCH: 29 pg (ref 26.0–34.0)
MCHC: 32.2 g/dL (ref 30.0–36.0)
MCV: 89.8 fL (ref 78.0–100.0)
Platelets: 320 10*3/uL (ref 150–400)
RBC: 4.42 MIL/uL (ref 3.87–5.11)
RDW: 13.7 % (ref 11.5–15.5)
WBC: 7.4 10*3/uL (ref 4.0–10.5)

## 2015-12-13 MED ORDER — ONDANSETRON 4 MG PO TBDP
4.0000 mg | ORAL_TABLET | Freq: Once | ORAL | Status: AC
Start: 2015-12-13 — End: 2015-12-13
  Administered 2015-12-13: 4 mg via ORAL
  Filled 2015-12-13: qty 1

## 2015-12-13 MED ORDER — MECLIZINE HCL 25 MG PO TABS
25.0000 mg | ORAL_TABLET | Freq: Once | ORAL | Status: AC
  Administered 2015-12-13: 25 mg via ORAL
  Filled 2015-12-13: qty 1

## 2015-12-13 MED ORDER — MECLIZINE HCL 25 MG PO TABS: 25.0000 mg | ORAL_TABLET | Freq: Three times a day (TID) | ORAL | 0 refills | Status: DC | PRN

## 2015-12-13 MED ORDER — OXYCODONE-ACETAMINOPHEN 5-325 MG PO TABS
1.0000 | ORAL_TABLET | Freq: Once | ORAL | Status: AC
  Administered 2015-12-13: 1 via ORAL
  Filled 2015-12-13: qty 1

## 2015-12-13 NOTE — ED Triage Notes (Signed)
Pt sts hx of chronic dizziness that was worse yesterday and today with fall yesterday; pt sts sent here for MRI and dizziness worse upon standing

## 2015-12-13 NOTE — ED Notes (Signed)
Pt. In MRI.

## 2015-12-13 NOTE — Telephone Encounter (Signed)
I advised pt to proceed to ED already as soon as possible. She was going to call someone to bring her. See note below.

## 2015-12-13 NOTE — Telephone Encounter (Addendum)
Dr Vickey Huger- See not below. You are listed as the WID this am. Would you recommend anything else for the pt?   Called pt back. Advised pt she needed to proceed to ED to be evaluated since she fell and hit her head. Advised her not to drive herself since she is dizzy. We want to make sure she does not hurt herself or others. She is going to call someone to bring her. She has f/u on 8/15 with Dr Lucia Gaskins and will f/u with her then after being seen in ED.

## 2015-12-13 NOTE — Telephone Encounter (Addendum)
Tried calling pt back. LVM for pt to call on Monday and schedule earlier f/u with Wynelle Cleveland. NP. Dr Lucia Gaskins wants her to be sooner than 8/15 If possible. Gave office hours.

## 2015-12-13 NOTE — Telephone Encounter (Signed)
She should go to the emergency room. Schedule her sooner for appointment with carolyn if possible. Thanks Dr. Vickey Huger.

## 2015-12-13 NOTE — ED Provider Notes (Signed)
[facility]-EMERGENCY DEPT Provider Note   CSN: [id number] Arrival date & time: 12/13/15  1155  First Provider Contact:  None          History   Chief Complaint Chief Complaint  Patient presents with  . Dizziness  . Fall    HPI Mallory Mills is a 60 y.o. female.  She presents for evaluation after a fall. She follows with a neurologist for postconcussion syndrome and migraine headaches. She describes increasing dizziness over the last several weeks. Sometimes lightheadedness, sometimes balance issues. She states she's had more difficulty using her fingers for fine motor such as taking her Dilantin her phone or writing. She states occasionally when she was riding her right hand will just "trail off". She states that she drove her granddaughter home from school 3 days ago and the granddaughter told her that "zNa-Na you scared me". Granddaughter stated that she was "swelling all over the road".  Essential lost her balance and fell backwards. She struck her head. She does not particularly hurt and her head or her neck. She has some pain in her low back. She called her neurologist and was concerned about her symptoms and was directed here. Had a normal MRI of her brain in December 2016 which she states was "normal".  A friend that accompanies her states that occasionally her speech seems slurred and it does today. Patient took her tramadol this morning at 10. She states she takes infrequently, and approximately 2-3 times per week.  HPI  Past Medical History:  Diagnosis Date  . Allergy   . Anxiety   . Depression   . Hypertension   . Kidney stones 2005  . Migraine   . Seizures (HCC)   . Sleep apnea     Patient Active Problem List   Diagnosis Date Noted  . Intractable chronic migraine without aura 09/18/2015  . Post concussive syndrome 02/26/2015  . Chronic headaches 09/07/2012  . Palpitations 09/07/2012    Past Surgical History:  Procedure Laterality Date  . APPENDECTOMY  1982    . CESAREAN SECTION  1978 & 1982   x2  . KNEE SURGERY  12/04/14  . TUBAL LIGATION  1982    OB History    No data available       Home Medications    Prior to Admission medications   Medication Sig Start Date End Date Taking? Authorizing Provider  acetaminophen (TYLENOL) 500 MG tablet Take 1,000 mg by mouth every 4 (four) hours as needed for headache (pain).   Yes Historical Provider, MD  Cholecalciferol (VITAMIN D3) 5000 units TABS Take 5,000 Units by mouth 2 (two) times daily.   Yes Historical Provider, MD  clonazePAM (KLONOPIN) 1 MG tablet Take 1 mg by mouth daily.    Yes Historical Provider, MD  Fexofenadine HCl (ALLEGRA PO) Take 1 tablet by mouth daily.   Yes Historical Provider, MD  fluticasone (FLONASE) 50 MCG/ACT nasal spray Place 1 spray into both nostrils daily as needed for allergies or rhinitis (congestion).    Yes Historical Provider, MD  gabapentin (NEURONTIN) 300 MG capsule Take 2 capsules (600 mg total) by mouth 3 (three) times daily. Patient taking differently: Take 300-600 mg by mouth See admin instructions. Take 1 tablet (300 mg) by mouth daily at lunch and 2 tablets (600 mg) at bedtime 09/17/15  Yes Anson Fret, MD  lamoTRIgine (LAMICTAL) 100 MG tablet Take 100-200 mg by mouth See admin instructions. Take 1 tablet (100 mg) by mouth every morning  and 2 tablets (200 mg) at bedtime   Yes Historical Provider, MD  metoprolol (LOPRESSOR) 50 MG tablet Take 50 mg by mouth 2 (two) times daily.   Yes Historical Provider, MD  Multiple Vitamin (MULTIVITAMIN WITH MINERALS) TABS tablet Take 1 tablet by mouth at bedtime.   Yes Historical Provider, MD  nortriptyline (PAMELOR) 25 MG capsule Take 2 capsules (50 mg total) by mouth at bedtime. Patient taking differently: Take 25 mg by mouth at bedtime.  06/03/15  Yes Anson Fret, MD  ondansetron (ZOFRAN ODT) 4 MG disintegrating tablet Take 1-2 at the onset of headache or dizziness. May repeat in 2 hours. May take with maxalt or  ultram. Patient taking differently: Take 4 mg by mouth 2 (two) times daily as needed (migraines). May repeat in 2 hours. May take with maxalt or ultram. 06/03/15  Yes Anson Fret, MD  OVER THE COUNTER MEDICATION Place 1 drop into both eyes 2 (two) times daily as needed (dry eyes). Over the counter lubricating eye drop   Yes Historical Provider, MD  rizatriptan (MAXALT) 10 MG tablet Take 10 mg by mouth See admin instructions. Take 1 tablet (10 mg) by mouth as needed for migraines, may repeat once in 2 hours if needed   Yes Historical Provider, MD  traMADol (ULTRAM) 50 MG tablet Take 50 mg by mouth 2 (two) times daily as needed (back pain/ migraine).    Yes Historical Provider, MD  valsartan-hydrochlorothiazide (DIOVAN-HCT) 320-12.5 MG tablet Take 1 tablet by mouth daily.   Yes Historical Provider, MD  venlafaxine XR (EFFEXOR-XR) 150 MG 24 hr capsule Take 150 mg by mouth at bedtime.   Yes Historical Provider, MD  zolpidem (AMBIEN) 10 MG tablet Take 10 mg by mouth at bedtime.    Yes Historical Provider, MD  meclizine (ANTIVERT) 25 MG tablet Take 1 tablet (25 mg total) by mouth 3 (three) times daily as needed for dizziness. 12/13/15   Rolland Porter, MD  venlafaxine XR (EFFEXOR XR) 75 MG 24 hr capsule Take 1 capsule (75 mg total) by mouth daily with breakfast. Patient not taking: Reported on 12/13/2015 06/03/15   Anson Fret, MD    Family History Family History  Problem Relation Age of Onset  . Cancer Mother     lung  . Cancer Maternal Aunt     breast  . Cancer Paternal Aunt     breast  . Heart disease Father   . Gout Son   . Heart disease Maternal Grandmother   . Hypertension Mother   . Hypertension Father     Social History Social History  Substance Use Topics  . Smoking status: Never Smoker  . Smokeless tobacco: Not on file  . Alcohol use Yes     Comment: 1-2/month     Allergies   Aspirin; Augmentin [amoxicillin-pot clavulanate]; and Vicodin  [hydrocodone-acetaminophen]   Review of Systems Review of Systems  Constitutional: Negative for appetite change, chills, diaphoresis, fatigue and fever.  HENT: Negative for mouth sores, sore throat and trouble swallowing.   Eyes: Negative for visual disturbance.  Respiratory: Negative for cough, chest tightness, shortness of breath and wheezing.   Cardiovascular: Negative for chest pain.  Gastrointestinal: Negative for abdominal distention, abdominal pain, diarrhea, nausea and vomiting.  Endocrine: Negative for polydipsia, polyphagia and polyuria.  Genitourinary: Negative for dysuria, frequency and hematuria.  Musculoskeletal: Negative for gait problem.  Skin: Negative for color change, pallor and rash.  Neurological: Positive for dizziness and speech difficulty. Negative for syncope,  light-headedness and headaches.       RUE "trembley"  Hematological: Does not bruise/bleed easily.  Psychiatric/Behavioral: Negative for behavioral problems and confusion.     Physical Exam Updated Vital Signs BP 107/71   Pulse 79   Temp 98 F (36.7 C) (Oral)   Resp 14   Ht  (1.6 m)   Wt 209 lb 4.8 oz (94.9 kg)   SpO2 97%   BMI 37.08 kg/m   Physical Exam  Constitutional: She is oriented to person, place, and time. She appears well-developed and well-nourished. No distress.  HENT:  Head: Normocephalic.  Eyes: Conjunctivae are normal. Pupils are equal, round, and reactive to light. No scleral icterus.  Neck: Normal range of motion. Neck supple. No thyromegaly present.  Cardiovascular: Normal rate and regular rhythm.  Exam reveals no gallop and no friction rub.   No murmur heard. Pulmonary/Chest: Effort normal and breath sounds normal. No respiratory distress. She has no wheezes. She has no rales.  Abdominal: Soft. Bowel sounds are normal. She exhibits no distension. There is no tenderness. There is no rebound.  Musculoskeletal: Normal range of motion.  Neurological: She is alert and  oriented to person, place, and time.  No obvious cranial nerve deficits. Her speech is slow and methodical. No facial droop. No carotid bruits. No pronator drift or leg drift. Her finger to nose is slower on the right versus left but accurate. Her heel-to-shin is slower on the left but accurate.  Skin: Skin is warm and dry. No rash noted.  Psychiatric: She has a normal mood and affect. Her behavior is normal.     ED Treatments / Results  Labs (all labs ordered are listed, but only abnormal results are displayed) Labs Reviewed  COMPREHENSIVE METABOLIC PANEL - Abnormal; Notable for the following:       Result Value   Chloride 98 (*)    Glucose, Bld 160 (*)    Creatinine, Ser 1.31 (*)    GFR calc non Af Amer 44 (*)    GFR calc Af Amer 51 (*)    All other components within normal limits  CBC    EKG  EKG Interpretation  Date/Time:  Friday December 13 2015 12:13:38 EDT Ventricular Rate:  94 PR Interval:  188 QRS Duration: 88 QT Interval:  372 QTC Calculation: 465 R Axis:   82 Text Interpretation:  Normal sinus rhythm Cannot rule out Anterior infarct , age undetermined Abnormal ECG Nonspecific ST abnormality No old tracing to compare Confirmed by MILLER  MD, BRIAN ([id number]) on 12/13/2015 3:12:00 PM       Radiology Dg Lumbar Spine Complete  Result Date: 12/13/2015 CLINICAL DATA:  Larey Seat yesterday in bedroom due to dizziness, low back pain since fall EXAM: LUMBAR SPINE - COMPLETE 4+ VIEW COMPARISON:  CT abdomen and pelvis 01/16/2015 FINDINGS: 5 non-rib-bearing lumbar vertebra. Vertebral body and disc space heights maintained. Slight irregularity and spur formation at the RIGHT lateral aspect of the inferior endplate of L2 is unchanged from prior CT. No definite fracture, subluxation or bone destruction. Facet degenerative changes lower lumbar spine. No spondylolysis. SI joints symmetric. Endplate spur formation at lower thoracic spine. Aortic atherosclerosis. IMPRESSION: Mild degenerative disc  disease changes of the lower thoracic and lumbar spine. No definite acute bony abnormalities. Aortic atherosclerosis. Electronically Signed   By: Ulyses Southward M.D.   On: 12/13/2015 17:15   Mr Brain Wo Contrast  Result Date: 12/13/2015 CLINICAL DATA:  Vertigo.  Right upper extremity dysmetria. EXAM: MRI  HEAD WITHOUT CONTRAST TECHNIQUE: Multiplanar, multiecho pulse sequences of the brain and surrounding structures were obtained without intravenous contrast. COMPARISON:  05/02/2015 FINDINGS: Two punctate foci of increased signal posteriorly in the midbrain on a single axial diffusion image are not confirmed on the coronal sequence and are favored to be artifactual. There is no evidence of acute infarct elsewhere. No intracranial hemorrhage, mass, midline shift, or extra-axial fluid collection is identified. Ventricles and sulci are within normal limits. No significant cerebral white matter disease is seen. Orbits are unremarkable. Paranasal sinuses and mastoid air cells are clear. Major intracranial vascular flow voids are preserved. No focal osseous lesion is identified. IMPRESSION: Negative. Electronically Signed   By: Sebastian Ache M.D.   On: 12/13/2015 19:29    Procedures Procedures (including critical care time)  Medications Ordered in ED Medications  meclizine (ANTIVERT) tablet 25 mg (not administered)  oxyCODONE-acetaminophen (PERCOCET/ROXICET) 5-325 MG per tablet 1 tablet (1 tablet Oral Given 12/13/15 1712)  ondansetron (ZOFRAN-ODT) disintegrating tablet 4 mg (4 mg Oral Given 12/13/15 1712)     Initial Impression / Assessment and Plan / ED Course  I have reviewed the triage vital signs and the nursing notes.  Pertinent labs & imaging results that were available during my care of the patient were reviewed by me and considered in my medical decision making (see chart for details).  Clinical Course   Negative L spine films. Normal MRI. We'll treat for peripheral vertigo. Primary care  follow-up.  Final Clinical Impressions(s) / ED Diagnoses   Final diagnoses:  Vertigo    New Prescriptions New Prescriptions   MECLIZINE (ANTIVERT) 25 MG TABLET    Take 1 tablet (25 mg total) by mouth 3 (three) times daily as needed for dizziness.     Rolland Porter, MD 12/13/15 2000

## 2015-12-13 NOTE — ED Notes (Signed)
Pt. sts that her neurologist told her to come here for an MRI because of an increasing amount of falls. She has been having spasms of the hand and increased inability to type words on the phone correctly and unaware of swerving while driving  Pt being treated for post concussive disorder and suffers migraines a few times a week.

## 2015-12-13 NOTE — Telephone Encounter (Signed)
Patient called to advise, she fell again yesterday and one other time this week, states she is sore and advises that she did hit her head, states the episodes she's been having with dizziness, comes on suddenly, "it's like I'm drunk", hand jerks. States Dr. Lucia Gaskins advised her, when she has any kind of strange things to let her know. Patient is calling to advise.

## 2015-12-13 NOTE — Telephone Encounter (Signed)
Advise to go to ED or urgent CARE , BUT DO NOT WAIT.   Melvyn Novas, MD

## 2015-12-16 ENCOUNTER — Ambulatory Visit (INDEPENDENT_AMBULATORY_CARE_PROVIDER_SITE_OTHER): Payer: BLUE CROSS/BLUE SHIELD | Admitting: Nurse Practitioner

## 2015-12-16 ENCOUNTER — Encounter: Payer: Self-pay | Admitting: Nurse Practitioner

## 2015-12-16 VITALS — BP 122/74 | HR 81 | Ht 63.0 in | Wt 208.4 lb

## 2015-12-16 DIAGNOSIS — H8110 Benign paroxysmal vertigo, unspecified ear: Secondary | ICD-10-CM | POA: Diagnosis not present

## 2015-12-16 DIAGNOSIS — R519 Headache, unspecified: Secondary | ICD-10-CM

## 2015-12-16 DIAGNOSIS — F0781 Postconcussional syndrome: Secondary | ICD-10-CM

## 2015-12-16 DIAGNOSIS — G43711 Chronic migraine without aura, intractable, with status migrainosus: Secondary | ICD-10-CM

## 2015-12-16 DIAGNOSIS — R51 Headache: Secondary | ICD-10-CM

## 2015-12-16 NOTE — Telephone Encounter (Signed)
Dr Lucia GaskinsAhern- FYI Checked status. Phone staff spoke to pt today. They scheduled earlier f/u on 12/16/15 with carolyn M, NP at 1130am.

## 2015-12-16 NOTE — Patient Instructions (Addendum)
Set up for vestibular rehabilitation Continue Neurontin 600 mg twice daily Stop Antivert May use Zofran for vertigo or dizziness when necessary Continue Pamelor 2 capsules at bedtime Continue Maxalt acutely Follow up in 6 weeks with dr. Lucia GaskinsAhern

## 2015-12-16 NOTE — Progress Notes (Addendum)
[facility]  PATIENT: Mallory Mills DOB: 1955/07/23   REASON FOR VISIT: Follow-up for migraine, vertigo, history of concussion HISTORY FROM: Patient    HISTORY OF PRESENT ILLNESS:Interval history 5/9/2017AA:  Since February she has had headaches every day, 12 a month are migrainous, can last 2-3 hours or 2-3 days each. She has dizziness even without headaches. Room spinning. Mostly when she moves her head, she has woken up with it and has rolled over in bed with vertigo. This worsened when she hit her head. Last week she has had a lot of relief from her headaches and she thinks this is due to less stress but they came back this week. The headaches and dizziness have severely impacted her life and she can't work, interferes with her daily work. Stress is a factor as well. She is very confused, she has difficulty reading things and needs to read and re-read them. She is even having difficulty driving due to the continuous headache and dizziness. She has severe fatigue, she is not sleeping well at ngiht, she wakes often due to her shoulder and she has a hard time shutting her mind down even with the [place] and [employer]. She had a cortizone shot into her shoulder which she hopes helps. She takes clonazepam due to her mood which has been impacted by her headaches and her constant pain. She is having severe anxiety over the situation at work and over her headaches. Sometimes she has dizziness or visual aura before the migraine as an aura, however often there is no aura preceding the migraine. The migraines are on the left side, throbbing, extreme light sensitivity and sound sensitivity even voices are annoying, she has nausea with vomiting sometimes. Migraines are triggered by stress, depression, weather, perfume and other smells, no known food trigger. She is 100% compliant with cpap.   Medications she has tried for migraines: Effexor, Nortriptyline, fioricet, lamictal, reglan,  metoprolol,toradol shots,  tramadol, can't use Topamax or Zonegran due to kidney stones, starting Neurontin today UPDATE 08/07/2017CM Mallory Mills, 60 year old female returns for follow-up. She was seen in the ER on 12/13/2015 for vertigo and given meclizine which does not help. She tells me that her blood sugar was 60 or 70 that morning . She did not correlate dizziness to that.  She has a history of migraines and dizziness. She no longer works. She has never tried vestibular rehabilitation for long-standing vertigo. She's been on multiple medications in the past for headaches. Neurontin was started at her last visit and she is currently taking 600 mg twice daily. She claims she cannot increase the dose further and she is also on Pamelor 50 mg at bedtime she takes Maxalt acutely. Her headaches are improved She returns for reevaluation.  REVIEW OF SYSTEMS: Full 14 system review of systems performed and notable only for those listed, all others are neg:  Constitutional: neg  Cardiovascular: neg Ear/Nose/Throat: Ringing in the ears  Skin: neg Eyes: Blurred vision Respiratory: neg Gastroitestinal: neg  Hematology/Lymphatic: neg  Endocrine: neg Musculoskeletal: Walking difficulty Allergy/Immunology: neg Neurological: Dizziness headache Psychiatric: Depression and anxiety Sleep : neg   ALLERGIES: Allergies  Allergen Reactions  . Aspirin Itching  . Augmentin [Amoxicillin-Pot Clavulanate] Diarrhea  . Vicodin [Hydrocodone-Acetaminophen] Itching    HOME MEDICATIONS: Outpatient Medications Prior to Visit  Medication Sig Dispense Refill  . acetaminophen (TYLENOL) 500 MG tablet Take 1,000 mg by mouth every 4 (four) hours as needed for headache (pain).    . Cholecalciferol (VITAMIN D3)  5000 units TABS Take 5,000 Units by mouth 2 (two) times daily.    . clonazePAM (KLONOPIN) 1 MG tablet Take 1 mg by mouth daily.     Marland Kitchen Fexofenadine HCl (ALLEGRA PO) Take 1 tablet by mouth daily.    . fluticasone  (FLONASE) 50 MCG/ACT nasal spray Place 1 spray into both nostrils daily as needed for allergies or rhinitis (congestion).     . gabapentin (NEURONTIN) 300 MG capsule Take 2 capsules (600 mg total) by mouth 3 (three) times daily. (Patient taking differently: Take 300-600 mg by mouth See admin instructions. Take 1 tablet (300 mg) by mouth daily at lunch and 2 tablets (600 mg) at bedtime) 180 capsule 4  . lamoTRIgine (LAMICTAL) 100 MG tablet Take 100-200 mg by mouth See admin instructions. Take 1 tablet (100 mg) by mouth every morning and 2 tablets (200 mg) at bedtime    . meclizine (ANTIVERT) 25 MG tablet Take 1 tablet (25 mg total) by mouth 3 (three) times daily as needed for dizziness. 30 tablet 0  . metoprolol (LOPRESSOR) 50 MG tablet Take 50 mg by mouth 2 (two) times daily.    . Multiple Vitamin (MULTIVITAMIN WITH MINERALS) TABS tablet Take 1 tablet by mouth at bedtime.    . nortriptyline (PAMELOR) 25 MG capsule Take 2 capsules (50 mg total) by mouth at bedtime. (Patient taking differently: Take 25 mg by mouth at bedtime. ) 60 capsule 6  . ondansetron (ZOFRAN ODT) 4 MG disintegrating tablet Take 1-2 at the onset of headache or dizziness. May repeat in 2 hours. May take with maxalt or ultram. (Patient taking differently: Take 4 mg by mouth 2 (two) times daily as needed (migraines). May repeat in 2 hours. May take with maxalt or ultram.) 60 tablet 6  . OVER THE COUNTER MEDICATION Place 1 drop into both eyes 2 (two) times daily as needed (dry eyes). Over the counter lubricating eye drop    . rizatriptan (MAXALT) 10 MG tablet Take 10 mg by mouth See admin instructions. Take 1 tablet (10 mg) by mouth as needed for migraines, may repeat once in 2 hours if needed    . traMADol (ULTRAM) 50 MG tablet Take 50 mg by mouth 2 (two) times daily as needed (back pain/ migraine).     . valsartan-hydrochlorothiazide (DIOVAN-HCT) 320-12.5 MG tablet Take 1 tablet by mouth daily.    Marland Kitchen venlafaxine XR (EFFEXOR-XR) 150 MG 24  hr capsule Take 150 mg by mouth at bedtime.    Marland Kitchen zolpidem (AMBIEN) 10 MG tablet Take 10 mg by mouth at bedtime.     Marland Kitchen venlafaxine XR (EFFEXOR XR) 75 MG 24 hr capsule Take 1 capsule (75 mg total) by mouth daily with breakfast. (Patient not taking: Reported on 12/16/2015) 30 capsule 11   No facility-administered medications prior to visit.     PAST MEDICAL HISTORY: Past Medical History:  Diagnosis Date  . Allergy   . Anxiety   . Depression   . Hypertension   . Kidney stones 2005  . Migraine   . Seizures (HCC)   . Sleep apnea     PAST SURGICAL HISTORY: Past Surgical History:  Procedure Laterality Date  . APPENDECTOMY  1982  . CESAREAN SECTION  1978 & 1982   x2  . KNEE SURGERY  12/04/14  . TUBAL LIGATION  1982    FAMILY HISTORY: Family History  Problem Relation Age of Onset  . Cancer Mother     lung  . Cancer Maternal Aunt  breast  . Cancer Paternal Aunt     breast  . Heart disease Father   . Gout Son   . Heart disease Maternal Grandmother   . Hypertension Mother   . Hypertension Father     SOCIAL HISTORY: Social History   Social History  . Marital status: Divorced    Spouse name: N/A  . Number of children: 2  . Years of education: 12+   Occupational History  . Administration Unemployed  . [employer] [place]     Social History Main Topics  . Smoking status: Never Smoker  . Smokeless tobacco: Never Used  . Alcohol use Yes     Comment: 1-2/month  . Drug use: No  . Sexual activity: Not on file   Other Topics Concern  . Not on file   Social History Narrative   Lives at home with brother   Caffeine use: 1 coffee or soft drink per day     PHYSICAL EXAM  Vitals:   12/16/15 1109  BP: 122/74  Pulse: 81  Weight: 208 lb 6.4 oz (94.5 kg)  Height: 5\' 3"  (1.6 m)   Body mass index is 36.92 kg/m.  Generalized: Well developed, in no acute distress  Head: normocephalic and atraumatic,. Oropharynx benign  Neck: Supple, no carotid bruits  Cardiac:  Regular rate rhythm, no murmur  Musculoskeletal: No deformity   Neurological examination   Mentation: Alert oriented to time, place, history taking. Attention span and concentration appropriate. Recent and remote memory intact.  Follows all commands speech and language fluent.   Cranial nerve II-XII: Pupils were equal round reactive to light extraocular movements were full, visual field were full on confrontational test. Facial sensation and strength were normal. hearing was intact to finger rubbing bilaterally. Uvula tongue midline. head turning and shoulder shrug were normal and symmetric.Tongue protrusion into cheek strength was normal. Motor: normal bulk and tone, full strength in the BUE, BLE, fine finger movements normal, no pronator drift. No focal weakness Sensory: normal and symmetric to light touch, pinprick, and  Vibration, in the upper and lower extremities Coordination: finger-nose-finger, heel-to-shin bilaterally, no dysmetria Reflexes: Brachioradialis 2/2, biceps 2/2, triceps 2/2, patellar 2/2, Achilles 2/2, plantar responses were flexor bilaterally. Gait and Station: Rising up from seated position without assistance, normal stance,  moderate stride, good arm swing, smooth turning, able to perform tiptoe, and heel walking without difficulty. Tandem gait is mildly unsteady . No assistive device  DIAGNOSTIC DATA (LABS, IMAGING, TESTING) - I reviewed patient records, labs, notes, testing and imaging myself where available.  Lab Results  Component Value Date   WBC 7.4 12/13/2015   HGB 12.8 12/13/2015   HCT 39.7 12/13/2015   MCV 89.8 12/13/2015   PLT 320 12/13/2015      Component Value Date/Time   NA 139 12/13/2015 1220   K 3.9 12/13/2015 1220   CL 98 (L) 12/13/2015 1220   CO2 30 12/13/2015 1220   GLUCOSE 160 (H) 12/13/2015 1220   BUN 10 12/13/2015 1220   CREATININE 1.31 (H) 12/13/2015 1220   CALCIUM 10.0 12/13/2015 1220   PROT 7.0 12/13/2015 1220   ALBUMIN 4.3  12/13/2015 1220   AST 33 12/13/2015 1220   ALT 29 12/13/2015 1220   ALKPHOS 116 12/13/2015 1220   BILITOT 0.6 12/13/2015 1220   GFRNONAA 44 (L) 12/13/2015 1220   GFRAA 51 (L) 12/13/2015 1220    ASSESSMENT AND PLAN  60 y.o. year old female  has a past medical history of  Concussion syndrome and intractable chronic migraine disorder who has failed  multiple medications in the past. In addition she has a history of dizziness and vertigo. She was recently seen in the emergency room for dizziness and given  meclizine which was not beneficial.  Set up for vestibular rehabilitation Continue Neurontin 600 mg twice daily Stop Antivert May use Zofran for vertigo or dizziness when necessary Continue Pamelor 2 capsules at bedtime Continue Maxalt acutely Follow up in 6 weeks with Dr. Lucia GaskinsAhern after rehabilitation is completed Nilda RiggsNancy Carolyn Martin, [facility] Hosp. At StanfordGNP, [facility] RockBC, APRN  [facility] [address], [address] [place], [place] [address] [phone]  Personally  participated in and made any corrections needed to history, physical, neuro exam,assessment and plan as stated above.   Naomie DeanAntonia Ahern, MD [facility]

## 2015-12-24 ENCOUNTER — Ambulatory Visit: Payer: Commercial Managed Care - HMO | Admitting: Neurology

## 2015-12-31 DIAGNOSIS — Z0289 Encounter for other administrative examinations: Secondary | ICD-10-CM

## 2016-01-15 ENCOUNTER — Ambulatory Visit
Payer: BLUE CROSS/BLUE SHIELD | Attending: Nurse Practitioner | Admitting: Rehabilitative and Restorative Service Providers"

## 2016-01-15 DIAGNOSIS — R42 Dizziness and giddiness: Secondary | ICD-10-CM | POA: Diagnosis present

## 2016-01-15 DIAGNOSIS — R2689 Other abnormalities of gait and mobility: Secondary | ICD-10-CM | POA: Diagnosis present
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
## 2016-01-15 NOTE — Patient Instructions (Signed)
Gaze Stabilization: Tip Card  1.Target must remain in focus, not blurry, and appear stationary while head is in motion. 2.Perform exercises with small head movements (45 to either side of midline). 3.Increase speed of head motion so long as target is in focus. 4.If you wear eyeglasses, be sure you can see target through lens (therapist will give specific instructions for bifocal / progressive lenses). 5.These exercises may provoke dizziness or nausea. Work through these symptoms. If too dizzy, slow head movement slightly. Rest between each exercise. 6.Exercises demand concentration; avoid distractions. 7.For safety, perform standing exercises close to a counter, wall, corner, or next to someone.  Copyright  VHI. All rights reserved.   Gaze Stabilization: Sitting    Keeping eyes on target on wall 3 feet away, tilt head down slightly and move head side to side for 5 reps.  Do __3__ sessions per day.   Copyright  VHI. All rights reserved.   Special Instructions: Exercises may bring on mild to moderate symptoms of dizziness or headaches that resolve within 30 minutes of completing exercises. If symptoms are lasting longer than 30 minutes, modify your exercises by:  >decreasing the # of times you complete each activity >ensuring your symptoms return to baseline before moving onto the next exercise >dividing up exercises so you do not do them all in one session, but multiple short sessions throughout the day >doing them once a day until symptoms improve

## 2016-01-20 ENCOUNTER — Ambulatory Visit: Payer: BLUE CROSS/BLUE SHIELD | Admitting: Rehabilitative and Restorative Service Providers"

## 2016-01-20 DIAGNOSIS — R42 Dizziness and giddiness: Secondary | ICD-10-CM | POA: Diagnosis not present

## 2016-01-20 DIAGNOSIS — R2689 Other abnormalities of gait and mobility: Secondary | ICD-10-CM

## 2016-01-20 NOTE — Patient Instructions (Signed)
Tip Card  1.The goal of habituation training is to assist in decreasing symptoms of vertigo, dizziness, or nausea provoked by specific head and body motions. 2.These exercises may initially increase symptoms; however, be persistent and work through symptoms. With repetition and time, the exercises will assist in reducing or eliminating symptoms. 3.Exercises should be stopped and discussed with the therapist if you experience any of the following: - Sudden change or fluctuation in hearing - New onset of ringing in the ears, or increase in current intensity - Any fluid discharge from the ear - Severe pain in neck or back - Extreme nausea  Copyright  VHI. All rights reserved.  Rolling    With pillow under head, start on back. Roll slowly to right. Hold position until symptoms subside. Roll slowly onto left side. Hold position until symptoms subside. Repeat sequence __3__ times per session. Do __2__ sessions per day.  Copyright  VHI. All rights reserved.  Head Motion: Side to Side    Sitting, tilt head down slightly, slowly move head to right and then left with eyes open.  Repeat _5___ times per session. Do _2___ sessions per day.  Copyright  VHI. All rights reserved.  Head Motion: Up and Down    Sitting, slowly move head up and down with eyes open.  Repeat _5___ times per session. Do __2__ sessions per day.  Copyright  VHI. All rights reserved.   Walking Program:  Begin walking for exercise for 5 minutes, 2 times/day, 5 days/week.   Progress your walking program by adding 1-2 minutes to your routine each week, as tolerated. Be sure to wear good walking shoes, walk in a safe environment and only progress to your tolerance.       Special Instructions: Exercises may bring on mild to moderate symptoms of dizziness, headaches, nausea that resolve within 30 minutes of completing exercises. If symptoms are lasting longer than 30 minutes, modify your exercises by:  >decreasing the  # of times you complete each activity >ensuring your symptoms return to baseline before moving onto the next exercise >dividing up exercises so you do not do them all in one session, but multiple short sessions throughout the day >doing them once a day until symptoms improve

## 2016-01-20 NOTE — Therapy (Signed)
[facility] [address] [place], [place], [address] Phone: [phone]   Fax:  [phone]  Physical Therapy Treatment  Patient Details  Name: Mallory Mills MRN: [id number] Date of Birth: August 02, 1955 Referring Provider: Cecille Rubin, NP  Encounter Date: 01/20/2016      PT End of Session - 01/20/16 1230    Visit Number 2   Number of Visits 8   Date for PT Re-Evaluation 03/15/16   Authorization Type Private insurance 30 visit limit/year   PT Start Time 5573   PT Stop Time 1235   PT Time Calculation (min) 40 min   Activity Tolerance Other (comment)  limited by onset of migraine aura symptoms   Behavior During Therapy [facility] for tasks assessed/performed      Past Medical History:  Diagnosis Date  . Allergy   . Anxiety   . Depression   . Hypertension   . Kidney stones 2005  . Migraine   . Seizures ([place])   . Sleep apnea     Past Surgical History:  Procedure Laterality Date  . APPENDECTOMY  1982  . [place]   x2  . KNEE SURGERY  12/04/14  . TUBAL LIGATION  1982    There were no vitals filed for this visit.      Subjective Assessment - 01/20/16 1154    Subjective The patient reports that the exercises aggravate her headaches and spinning.  She is taking medications (nausea, dizziness and pain pill) trying to reduce symtpoms.   Headache currently is "at the fringes", dizziness at rest is 3-4/10.  Dizziness has been up to 8/10 over past couple of days.    Pertinent History multiple concussions per subjective, migraines ("a good week is 3 times per week" lasting hours to days)   Patient Stated Goals Reduce dizziness because I am unable to drive, headache reduction, my neck and back also bother me at times.     Currently in Pain? No/denies                Vestibular Assessment - 01/20/16 1157      Vestibular Assessment   General Observation The patient has baseline dizziness  3-4/10.     Sidelying Right   Sidelying Right Duration continues at 3-4/10 (baseline)- no change   Sidelying Right Symptoms No nystagmus     Sidelying Left   Sidelying Left Duration Reduces to approximately 2/10, return to sit provokes a 5/10.   Sidelying Left Symptoms No nystagmus     Horizontal Canal Right   Horizontal Canal Right Duration 4-5/10 with R rolling   Horizontal Canal Right Symptoms Normal     Horizontal Canal Left   Horizontal Canal Left Duration 2-3/10 with R rolling   Horizontal Canal Left Symptoms Normal                 [facility] Adult PT Treatment/Exercise - 01/20/16 1224      Ambulation/Gait   Ambulation/Gait Yes   Ambulation/Gait Assistance 7: Independent   Gait Comments Walked non-stop x 5.5 minutes for motion tolerance.           Vestibular Treatment/Exercise - 01/20/16 1203      Vestibular Treatment/Exercise   Vestibular Treatment Provided Habituation;Gaze   Habituation Exercises Horizontal Roll;Seated Horizontal Head Turns;Seated Vertical Head Turns   Gaze Exercises X1 Viewing Horizontal     Horizontal Roll   Number of Reps  3   Symptom Description  2/10, initially  worse to the left and with repetition, began to settle quicker     Seated Horizontal Head Turns   Number of Reps  5   Symptom Description  neck tightness, not dizzy     Seated Vertical Head Turns   Number of Reps  5   Symptom Description  initially began with partial ROM cervical flexion> midline x 5, then progressed to cervical flexion<>extension x 5 with 3-4/10 once she stops head motion.       X1 Viewing Horizontal   Comments held off on this exercise and removed from HEP due to sensitivity and creating greater dizziness and migraines               PT Education - 01/20/16 1229    Education provided Yes   Education Details d/c gaze exercises, motion sensitivity program:  rolling, head motion up/down, head motin side/side, walking.   Person(s) Educated Patient    Methods Explanation;Demonstration;Handout   Comprehension Verbalized understanding;Returned demonstration          PT Short Term Goals - 01/20/16 1500      PT SHORT TERM GOAL #1   Title The patient will be indep with HEP for gaze adaptation, habituation, balance and general mobility.   Baseline Target date 02/13/2016   Time 4   Period Weeks   Status On-going     PT SHORT TERM GOAL #2   Title The patient will have further positional testing complete as tolerated and goals to follow, as indicated.   Baseline Met on 01/20/2016 with motion sensitivity with positional teesting, but no true nystagmus noted in room light.    Time 4   Period Weeks   Status Achieved     PT SHORT TERM GOAL #3   Title The patient will tolerate gaze x 1 adaptation exercises x 30 seconds with dizziness < or equal to 2/10.   Baseline Target date 02/13/2016   Time 4   Period Weeks   Status On-going     PT SHORT TERM GOAL #4   Title The patient will tolerate sit<>R sidelying without subjective reports of dizziness.   Baseline Target date 02/13/2016   Time 4   Period Weeks   Status On-going           PT Long Term Goals - 01/15/16 2201      PT LONG TERM GOAL #1   Title The patient will be indep with progression of HEP.   Baseline Target date 03/15/2016   Time 8   Period Weeks     PT LONG TERM GOAL #2   Title The patient will report no dizziness with bed mobility tasks.   Baseline Target date 03/15/2016   Time 8   Period Weeks     PT LONG TERM GOAL #3   Title The patient will be further assessed for gait/balance deficits and goals to follow, as indicated.   Baseline Target date 03/15/2016   Time 8   Period Weeks               Plan - 01/20/16 1235    Clinical Impression Statement PT modified prior HEP and removed gaze as this was provoking symtpoms.  The patient's positional testing was completed today with no nystagmus viewed, but motion sensitivity noted.  Developed HEP for rolling and  seated head motion to begin working through and added home walking x 5 minutes to improve overall mobility. Continue working towards STGs/LTGs.    PT Treatment/Interventions ADLs/Self Care  Home Management;Canalith Repostioning;Vestibular;Patient/family education;Functional mobility training;Gait training;Neuromuscular re-education;Balance training;Therapeutic exercise;Therapeutic activities;Stair training   PT Next Visit Plan Check habituation HEP, provide further balance exercise in corner with head motion, progress HEP as able.   Consulted and Agree with Plan of Care Patient      Patient will benefit from skilled therapeutic intervention in order to improve the following deficits and impairments:  Abnormal gait, Decreased balance, Dizziness, Difficulty walking  Visit Diagnosis: Dizziness and giddiness  Other abnormalities of gait and mobility     Problem List Patient Active Problem List   Diagnosis Date Noted  . Benign paroxysmal positional vertigo 12/16/2015  . Intractable chronic migraine without aura 09/18/2015  . Post concussive syndrome 02/26/2015  . Chronic headaches 09/07/2012  . Palpitations 09/07/2012    Tomisha Reppucci, PT 01/20/2016, 3:01 PM  [facility] [address] [place], [place], [address] Phone: [phone]   Fax:  [phone]  Name: Mallory Mills MRN: [id number] Date of Birth: [birth year]

## 2016-01-27 ENCOUNTER — Encounter: Payer: Self-pay | Admitting: Neurology

## 2016-01-27 ENCOUNTER — Ambulatory Visit (INDEPENDENT_AMBULATORY_CARE_PROVIDER_SITE_OTHER): Payer: BLUE CROSS/BLUE SHIELD | Admitting: Neurology

## 2016-01-27 VITALS — BP 154/96 | HR 100 | Ht 63.0 in | Wt 211.2 lb

## 2016-01-27 DIAGNOSIS — R258 Other abnormal involuntary movements: Secondary | ICD-10-CM | POA: Diagnosis not present

## 2016-01-27 DIAGNOSIS — R252 Cramp and spasm: Secondary | ICD-10-CM

## 2016-01-27 DIAGNOSIS — G43719 Chronic migraine without aura, intractable, without status migrainosus: Secondary | ICD-10-CM | POA: Diagnosis not present
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
## 2016-01-27 NOTE — Patient Instructions (Signed)
Remember to drink plenty of fluid, eat healthy meals and do not skip any meals. Try to eat protein with a every meal and eat a healthy snack such as fruit or nuts in between meals. Try to keep a regular sleep-wake schedule and try to exercise daily, particularly in the form of walking, 20-30 minutes a day, if you can.   As far as your medications are concerned, I would like to suggest: continue current medications  I would like to see you back for botox for migraines, sooner if we need to. Please call us with any interim questions, concerns, problems, updates or refill requests.   Our phone number is [phone]. We also have an after hours call service for urgent matters and there is a physician on-call for urgent questions. For any emergencies you know to call 911 or go to the nearest emergency room

## 2016-01-31 ENCOUNTER — Ambulatory Visit: Payer: BLUE CROSS/BLUE SHIELD | Admitting: Rehabilitative and Restorative Service Providers"

## 2016-01-31 DIAGNOSIS — R2689 Other abnormalities of gait and mobility: Secondary | ICD-10-CM

## 2016-01-31 DIAGNOSIS — R42 Dizziness and giddiness: Secondary | ICD-10-CM | POA: Diagnosis not present

## 2016-01-31 NOTE — Therapy (Signed)
[facility] [address] [place], [place], [address] Phone: [phone]   Fax:  [phone]  Physical Therapy Treatment  Patient Details  Name: Mallory Mills MRN: [id number] Date of Birth: 1955-10-08 Referring Provider: Darrol Angel, NP  Encounter Date: 01/31/2016      PT End of Session - 01/31/16 1308    Visit Number 3   Number of Visits 8   Date for PT Re-Evaluation 03/15/16   Authorization Type Private insurance 30 visit limit/year   PT Start Time 1230   PT Stop Time 1312   PT Time Calculation (min) 42 min   Activity Tolerance Other (comment)  limited by onset of migraine aura symptoms   Behavior During Therapy [facility] for tasks assessed/performed      Past Medical History:  Diagnosis Date  . Allergy   . Anxiety   . Depression   . Hypertension   . Kidney stones 2005  . Migraine   . Seizures (HCC)   . Sleep apnea     Past Surgical History:  Procedure Laterality Date  . APPENDECTOMY  1982  . CESAREAN SECTION  1978 & 1982   x2  . KNEE SURGERY  12/04/14  . TUBAL LIGATION  1982    There were no vitals filed for this visit.      Subjective Assessment - 01/31/16 1232    Subjective The patient reports removing gaze exercise helped diminsih headaches with exercises.  The patient reports she feels she pulls to the left side during gait.  The patient fell Monday while getting into car at the grocery store--she fell into the grass.     Pertinent History multiple concussions per subjective, migraines ("a good week is 3 times per week" lasting hours to days)   Patient Stated Goals Reduce dizziness because I am unable to drive, headache reduction, my neck and back also bother me at times.     Currently in Pain? No/denies                Vestibular Assessment - 01/31/16 1240      Positional Testing   Sidelying Test Sidelying Right;Sidelying Left   Horizontal Canal Testing Horizontal Canal  Right;Horizontal Canal Left     Sidelying Right   Sidelying Right Duration none   Sidelying Right Symptoms No nystagmus     Sidelying Left   Sidelying Left Duration none   Sidelying Left Symptoms No nystagmus                 [facility] Adult PT Treatment/Exercise - 01/31/16 1251      Ambulation/Gait   Ambulation/Gait Yes   Gait Comments Patient requires CGA to min A during dynamic walking activities.  She intermittently uses lenses t/o the day.  Performed head motion vertical and horizontal planes with min A for safety.        Neuro Re-ed    Neuro Re-ed Details  Performed sidestepping x 10 feet x 6 reps, with cues to decrease UE support.  Performed corner balance exercises with eyes closed and intermittent UE support.  Repeated sit<>sidelying without dizziness.  Mild sense of dizziness with return to sit from R side.  The patient performed seated head motion.  Also performed backwards walking emphasizing step length for stepping strategies.                 PT Education - 01/31/16 1307    Education provided Yes   Education Details HEP: updated by adding  sidestepping and standing with eyes closed.   Person(s) Educated Patient   Methods Explanation;Demonstration;Handout   Comprehension Verbalized understanding;Returned demonstration          PT Short Term Goals - 01/31/16 1318      PT SHORT TERM GOAL #1   Title The patient will be indep with HEP for gaze adaptation, habituation, balance and general mobility.   Baseline Met on 01/31/2016   Time 4   Period Weeks   Status Achieved     PT SHORT TERM GOAL #2   Title The patient will have further positional testing complete as tolerated and goals to follow, as indicated.   Baseline Met on 01/20/2016 with motion sensitivity with positional teesting, but no true nystagmus noted in room light.    Time 4   Period Weeks   Status Achieved     PT SHORT TERM GOAL #3   Title The patient will tolerate gaze x 1 adaptation  exercises x 30 seconds with dizziness < or equal to 2/10.   Baseline Target date 02/13/2016   Time 4   Period Weeks   Status On-going     PT SHORT TERM GOAL #4   Title The patient will tolerate sit<>R sidelying without subjective reports of dizziness.   Baseline Target date 02/13/2016   Time 4   Period Weeks   Status On-going           PT Long Term Goals - 01/15/16 2201      PT LONG TERM GOAL #1   Title The patient will be indep with progression of HEP.   Baseline Target date 03/15/2016   Time 8   Period Weeks     PT LONG TERM GOAL #2   Title The patient will report no dizziness with bed mobility tasks.   Baseline Target date 03/15/2016   Time 8   Period Weeks     PT LONG TERM GOAL #3   Title The patient will be further assessed for gait/balance deficits and goals to follow, as indicated.   Baseline Target date 03/15/2016   Time 8   Period Weeks               Plan - 01/31/16 1319    Clinical Impression Statement The patient c/o continued falls and unsteadiness.  PT provided balance activities for home with emphasis on performing in safe way.  Patient feels less frequency of migraines without perfomring gaze exercises.   PT Treatment/Interventions ADLs/Self Care Home Management;Canalith Repostioning;Vestibular;Patient/family education;Functional mobility training;Gait training;Neuromuscular re-education;Balance training;Therapeutic exercise;Therapeutic activities;Stair training   PT Next Visit Plan Check HEP, progress HEP and train balance/dynamic gait   Consulted and Agree with Plan of Care Patient      Patient will benefit from skilled therapeutic intervention in order to improve the following deficits and impairments:  Abnormal gait, Decreased balance, Dizziness, Difficulty walking  Visit Diagnosis: Dizziness and giddiness  Other abnormalities of gait and mobility     Problem List Patient Active Problem List   Diagnosis Date Noted  . Benign paroxysmal  positional vertigo 12/16/2015  . Intractable chronic migraine without aura 09/18/2015  . Post concussive syndrome 02/26/2015  . Chronic headaches 09/07/2012  . Palpitations 09/07/2012    Caydence Koenig, PT 01/31/2016, 1:24 PM  [facility] [address] [place], [place], [address] Phone: [phone]   Fax:  [phone]  Name: Mallory Mills MRN: [id number] Date of Birth: February 20, 1956

## 2016-01-31 NOTE — Patient Instructions (Signed)
FUNCTIONAL MOBILITY: Side Step    Step sideways for _10__ feet. Repeat in opposite direction.  BE NEAR A COUNTERTOP OR WALL AND HOLD AS NEEDED. _3-4 times up/down countertop or in hall. 1-2 times/day.  Copyright  VHI. All rights reserved.   Feet Apart, Varied Arm Positions - Eyes Closed    STAND WITH A CHAIR IN FRONT OF YOU AND USE 2 FINGER TOUCH for support. Stand with feet shoulder width apart and arms out. Close eyes and visualize upright position. Hold __5__ seconds. Repeat __3__ times per session. Do __1-2__ sessions per day. WORK UP TO 10 seconds.  Copyright  VHI. All rights reserved.   Tip Card  1.The goal of habituation training is to assist in decreasing symptoms of vertigo, dizziness, or nausea provoked by specific head and body motions. 2.These exercises may initially increase symptoms; however, be persistent and work through symptoms. With repetition and time, the exercises will assist in reducing or eliminating symptoms. 3.Exercises should be stopped and discussed with the therapist if you experience any of the following: - Sudden change or fluctuation in hearing - New onset of ringing in the ears, or increase in current intensity - Any fluid discharge from the ear - Severe pain in neck or back - Extreme nausea  Copyright  VHI. All rights reserved.  Rolling    With pillow under head, start on back. Roll slowly to right. Hold position until symptoms subside. Roll slowly onto left side. Hold position until symptoms subside. Repeat sequence __3__ times per session. Do __2__ sessions per day.  Copyright  VHI. All rights reserved.  Head Motion: Side to Side    Sitting, tilt head down slightly, slowly move head to right and then left with eyes open.  Repeat _5___ times per session. Do _2___ sessions per day.  Copyright  VHI. All rights reserved.  Head Motion: Up and Down    Sitting, slowly move head up and down with eyes open.  Repeat _5___ times per  session. Do __2__ sessions per day.  Copyright  VHI. All rights reserved.   Walking Program:  Begin walking for exercise for 5 minutes, 2 times/day, 5 days/week.   Progress your walking program by adding 1-2 minutes to your routine each week, as tolerated. Be sure to wear good walking shoes, walk in a safe environment and only progress to your tolerance.       Special Instructions: Exercises may bring on mild to moderate symptoms of dizziness, headaches, nausea that resolve within 30 minutes of completing exercises. If symptoms are lasting longer than 30 minutes, modify your exercises by:  >decreasing the # of times you complete each activity >ensuring your symptoms return to baseline before moving onto the next exercise >dividing up exercises so you do not do them all in one session, but multiple short sessions throughout the day >doing them once a day until symptoms improve    Mallory Mills, PT9/03/2016 3:02 PM Signed [facility] [address] [place], [place], [address] Phone: [phone]   Fax:  [phone]

## 2016-02-06 ENCOUNTER — Ambulatory Visit: Payer: BLUE CROSS/BLUE SHIELD | Admitting: Rehabilitative and Restorative Service Providers"

## 2016-02-06 ENCOUNTER — Telehealth: Payer: Self-pay | Admitting: Rehabilitative and Restorative Service Providers"

## 2016-02-06 NOTE — Telephone Encounter (Signed)
The patient cancelled today's visit reporting that she has migraines.  Told front office staff that exercises are aggravating symptoms.  PT recommended she return for another visit and last session, she showed progress and we may need to modify her HEP to be less provoking of symptoms.

## 2016-02-10 ENCOUNTER — Telehealth: Payer: Self-pay | Admitting: Neurology

## 2016-02-10 NOTE — Telephone Encounter (Signed)
Stephanie/BC [phone] called said PA is missing some info. She will fax it back

## 2016-02-10 NOTE — Telephone Encounter (Signed)
Patient called to inquire if Prior Berkley Harveyuth has been obtained for BOTOX appointment Thursday October 5th. Please call to advise [phone].

## 2016-02-12 ENCOUNTER — Ambulatory Visit
Payer: BLUE CROSS/BLUE SHIELD | Attending: Nurse Practitioner | Admitting: Rehabilitative and Restorative Service Providers"

## 2016-02-12 DIAGNOSIS — R2689 Other abnormalities of gait and mobility: Secondary | ICD-10-CM | POA: Diagnosis present

## 2016-02-12 DIAGNOSIS — R42 Dizziness and giddiness: Secondary | ICD-10-CM | POA: Insufficient documentation

## 2016-02-12 NOTE — Therapy (Signed)
[place] [address] [place], [place], [address] Phone: [phone]   Fax:  [phone]  Physical Therapy Treatment  Patient Details  Name: Mallory Mills MRN: [id number] Date of Birth: 12/20/1955 Referring Provider: Cecille Rubin, NP  Encounter Date: 02/12/2016      PT End of Session - 02/12/16 1109    Visit Number 4   Number of Visits 8   Date for PT Re-Evaluation 03/15/16   Authorization Type Private insurance 30 visit limit/year   PT Start Time 1104   PT Stop Time 1145   PT Time Calculation (min) 41 min   Activity Tolerance Patient tolerated treatment well   Behavior During Therapy [facility] for tasks assessed/performed      Past Medical History:  Diagnosis Date  . Allergy   . Anxiety   . Depression   . Hypertension   . Kidney stones 2005  . Migraine   . Seizures ([place])   . Sleep apnea     Past Surgical History:  Procedure Laterality Date  . APPENDECTOMY  1982  . [place]   x2  . KNEE SURGERY  12/04/14  . TUBAL LIGATION  1982    There were no vitals filed for this visit.      Subjective Assessment - 02/12/16 1104    Subjective The patient reports she fell off the edge of the bed (while putting thunder vest on her dog).  She fell and hit her head and landed on her upper back.  She rpeorts no warning factors prior to fall (no dizziness)--she got migraine immediately after the fall and it lasted x 2 days.   She was so sore from the fall she felt like she couldn't begin to move well until Saturday (this occurred on Wednesday).     Pertinent History multiple concussions per subjective, migraines ("a good week is 3 times per week" lasting hours to days)   Patient Stated Goals Reduce dizziness because I am unable to drive, headache reduction, my neck and back also bother me at times.     Currently in Pain? Yes   Pain Score 7    Pain Location Neck  low back was up to a 10/10 last  night--took  pain meds   Pain Descriptors / Indicators Aching   Pain Type Acute pain   Pain Onset In the past 7 days   Pain Frequency Constant   Aggravating Factors  recent fall aggravated   Pain Relieving Factors pain meds                Vestibular Assessment - 02/12/16 1114      Vestibular Assessment   General Observation The patient does not have dizziness at baseline today.     Positional Testing   Sidelying Test Sidelying Right;Sidelying Left   Horizontal Canal Testing Horizontal Canal Right;Horizontal Canal Left     Sidelying Right   Sidelying Right Duration 3/10 dizziness   Sidelying Right Symptoms No nystagmus  lightheaded with return to sitting.     Sidelying Left   Sidelying Left Duration none   Sidelying Left Symptoms No nystagmus                 [facility] Adult PT Treatment/Exercise - 02/12/16 1122      Ambulation/Gait   Ambulation/Gait Yes   Ambulation/Gait Assistance 6: Modified independent (Device/Increase time)  slowed pace   Ambulation Distance (Feet) 200 Feet   Assistive device None  Ambulation Surface Level   Gait Comments The patient performed gait with horizontal/vertical head motion with intermittent loss of balance (able to self recover without assist).  Patient also performed gait emphasizing increased speed of movement with tactile cues for arm swing by PT.      Self-Care   Self-Care Other Self-Care Comments   Other Self-Care Comments  Discussed sleeping positions due to patient c/o hand going numb duirng the night.  Patient demonstrated for carryover to home.     Neuro Re-ed    Neuro Re-ed Details  Standing balance exercises performing static standing with eyes closed in corner for support.          Vestibular Treatment/Exercise - 02/12/16 1114      Vestibular Treatment/Exercise   Vestibular Treatment Provided Habituation   Habituation Exercises Seated Horizontal Head Turns;Seated Vertical Head Turns;Standing Horizontal  Head Turns;Standing Vertical Head Turns     Seated Horizontal Head Turns   Number of Reps  5   Symptom Description  2-3/10 dizziness today     Seated Vertical Head Turns   Number of Reps  5   Symptom Description  2-3/10 dizziness     Standing Horizontal Head Turns   Number of Reps  5   Symptom Description  mild postural sway noted, no dizziness reported     Standing Vertical Head Turns   Number of Reps  5   Symptom Description  mild postural sway               PT Education - 02/12/16 1132    Education provided Yes   Education Details progressed home program to be standing horizontal/vertical head motion   Person(s) Educated Patient   Methods Explanation;Demonstration;Handout   Comprehension Verbalized understanding;Returned demonstration          PT Short Term Goals - 01/31/16 1318      PT SHORT TERM GOAL #1   Title The patient will be indep with HEP for gaze adaptation, habituation, balance and general mobility.   Baseline Met on 01/31/2016   Time 4   Period Weeks   Status Achieved     PT SHORT TERM GOAL #2   Title The patient will have further positional testing complete as tolerated and goals to follow, as indicated.   Baseline Met on 01/20/2016 with motion sensitivity with positional teesting, but no true nystagmus noted in room light.    Time 4   Period Weeks   Status Achieved     PT SHORT TERM GOAL #3   Title The patient will tolerate gaze x 1 adaptation exercises x 30 seconds with dizziness < or equal to 2/10.   Baseline Target date 02/13/2016   Time 4   Period Weeks   Status On-going     PT SHORT TERM GOAL #4   Title The patient will tolerate sit<>R sidelying without subjective reports of dizziness.   Baseline Target date 02/13/2016   Time 4   Period Weeks   Status On-going           PT Long Term Goals - 01/15/16 2201      PT LONG TERM GOAL #1   Title The patient will be indep with progression of HEP.   Baseline Target date 03/15/2016    Time 8   Period Weeks     PT LONG TERM GOAL #2   Title The patient will report no dizziness with bed mobility tasks.   Baseline Target date 03/15/2016   Time 8  Period Weeks     PT LONG TERM GOAL #3   Title The patient will be further assessed for gait/balance deficits and goals to follow, as indicated.   Baseline Target date 03/15/2016   Time 8   Period Weeks               Plan - 02/12/16 1559    Clinical Impression Statement The patient had a fall last week that delayed participation in [place].  PT and patient discussed nature of falls--appear unpredictable, without warning.  She remained sedentary for a couple of days due to soreness and is able to tolerate HEP today.  PT recommended she work on current HEP--will f/u in 1-2 weeks to check goals and progress program.    PT Next Visit Plan  Patient will call to cx if unable to participate in HEP over next week (prior to next week's appt).  Check HEP, progress HEP and train balance/dynamic gait   Consulted and Agree with Plan of Care Patient      Patient will benefit from skilled therapeutic intervention in order to improve the following deficits and impairments:  Abnormal gait, Decreased balance, Dizziness, Difficulty walking  Visit Diagnosis: Dizziness and giddiness  Other abnormalities of gait and mobility     Problem List Patient Active Problem List   Diagnosis Date Noted  . Benign paroxysmal positional vertigo 12/16/2015  . Intractable chronic migraine without aura 09/18/2015  . Post concussive syndrome 02/26/2015  . Chronic headaches 09/07/2012  . Palpitations 09/07/2012    Ji Feldner, PT 02/12/2016, 4:09 PM  [place] [address] [place], [place], [address] Phone: [phone]   Fax:  [phone]  Name: Mallory Mills MRN: [id number] Date of Birth: 10/02/1955

## 2016-02-12 NOTE — Patient Instructions (Signed)
For seated head turn side to side and up and down, progress to standing position.  Keep eyes open and be close to walls for support, but try not to hold wall unless you need support.   Continue with other exercises including side stepping, rolling, and corner standing with eyes closed.

## 2016-02-13 ENCOUNTER — Ambulatory Visit (INDEPENDENT_AMBULATORY_CARE_PROVIDER_SITE_OTHER): Payer: BLUE CROSS/BLUE SHIELD | Admitting: Neurology

## 2016-02-13 VITALS — BP 134/89 | HR 88

## 2016-02-13 DIAGNOSIS — G43719 Chronic migraine without aura, intractable, without status migrainosus: Secondary | ICD-10-CM | POA: Diagnosis not present

## 2016-02-13 NOTE — Progress Notes (Signed)
Botox-200 unitsx1 vial Lot: [id number] Expiration: 05/2017 [id number] NDC: [id number]   0.9% Sodium Chloride- 4mL total [id number] Expiration: 09/2017 NDC: [id number]

## 2016-02-13 NOTE — Progress Notes (Signed)

## 2016-02-14 ENCOUNTER — Telehealth: Payer: Self-pay | Admitting: Neurology

## 2016-02-14 MED ORDER — NORTRIPTYLINE HCL 25 MG PO CAPS: 50.0000 mg | ORAL_CAPSULE | Freq: Every day | ORAL | 6 refills | Status: DC

## 2016-02-14 NOTE — Telephone Encounter (Signed)
Patient requesting refill of nortriptyline (PAMELOR) 25 MG capsule [facility] [address] [place] [place] , [place] - [id number]

## 2016-02-19 ENCOUNTER — Ambulatory Visit: Payer: BLUE CROSS/BLUE SHIELD | Admitting: Rehabilitative and Restorative Service Providers"

## 2016-02-19 ENCOUNTER — Ambulatory Visit (INDEPENDENT_AMBULATORY_CARE_PROVIDER_SITE_OTHER): Payer: BLUE CROSS/BLUE SHIELD | Admitting: Neurology

## 2016-02-19 DIAGNOSIS — R42 Dizziness and giddiness: Secondary | ICD-10-CM

## 2016-02-19 DIAGNOSIS — R252 Cramp and spasm: Secondary | ICD-10-CM

## 2016-02-19 DIAGNOSIS — R2689 Other abnormalities of gait and mobility: Secondary | ICD-10-CM

## 2016-02-19 NOTE — Telephone Encounter (Signed)
Noted  

## 2016-02-20 NOTE — Therapy (Signed)
[facility] [address] [place], [place], [address] Phone: [phone]   Fax:  [phone]  Physical Therapy Treatment  Patient Details  Name: Mallory Mills MRN: [id number] Date of Birth: 02-22-1956 Referring Provider: Cecille Rubin, NP  Encounter Date: 02/19/2016      PT End of Session - 02/19/16 1118    Visit Number 5   Number of Visits 8   Date for PT Re-Evaluation 03/15/16   Authorization Type Private insurance 30 visit limit/year   PT Start Time 1106   PT Stop Time 1145   PT Time Calculation (min) 39 min   Equipment Utilized During Treatment Gait belt   Activity Tolerance Patient tolerated treatment well   Behavior During Therapy [facility] for tasks assessed/performed      Past Medical History:  Diagnosis Date  . Allergy   . Anxiety   . Depression   . Hypertension   . Kidney stones 2005  . Migraine   . Seizures ([place])   . Sleep apnea     Past Surgical History:  Procedure Laterality Date  . APPENDECTOMY  1982  . [place]   x2  . KNEE SURGERY  12/04/14  . TUBAL LIGATION  1982    There were no vitals filed for this visit.      Subjective Assessment - 02/19/16 1108    Subjective The patient does still experience episodic dizziness that she feels like can happen before or after a HA and last for variable times.  She does note a temporal relationship between headache and dizziness.  She also notes in general head motion provokes a general sensation of dizziness (even in the absence of headache).    "I almost fell this week, but caught myself."   Pertinent History multiple concussions per subjective, migraines ("a good week is 3 times per week" lasting hours to days)   Patient Stated Goals Reduce dizziness because I am unable to drive, headache reduction, my neck and back also bother me at times.     Currently in Pain? Yes   Pain Score 5   shots are helping in neck   Pain Location  Neck   Pain Orientation Right;Left   Pain Descriptors / Indicators Aching   Pain Type Chronic pain   Pain Onset 1 to 4 weeks ago   Pain Frequency Constant   Aggravating Factors  turning/reaching at the same time (like putting clothes up)   Pain Relieving Factors recent injections have improved headache and tightness in shoulders/neck                         [facility] Adult PT Treatment/Exercise - 02/19/16 1122      Ambulation/Gait   Ambulation/Gait Yes   Ambulation/Gait Assistance 6: Modified independent (Device/Increase time)  slowed time   Ambulation Distance (Feet) 300 Feet   Assistive device None   Ambulation Surface Level   Gait Comments The patient performed horiozntal and vertical head motion with gait with imbalance and sensation of nausea today.    Rested before walking performing starts/stops and 180 degree turns.      Self-Care   Self-Care Other Self-Care Comments   Other Self-Care Comments  Discussed continuation of current HEP and f/u in 2 weeks to determine if we can add VOR/gaze adaptation exercises.      Neuro Re-ed    Neuro Re-ed Details  Standing head motion with vertical head turns.  Seated head motion side to side.                    PT Short Term Goals - 02/19/16 1119      PT SHORT TERM GOAL #1   Title The patient will be indep with HEP for gaze adaptation, habituation, balance and general mobility.   Baseline Met on 01/31/2016   Time 4   Period Weeks   Status Achieved     PT SHORT TERM GOAL #2   Title The patient will have further positional testing complete as tolerated and goals to follow, as indicated.   Baseline Met on 01/20/2016 with motion sensitivity with positional teesting, but no true nystagmus noted in room light.    Time 4   Period Weeks   Status Achieved     PT SHORT TERM GOAL #3   Title The patient will tolerate gaze x 1 adaptation exercises x 30 seconds with dizziness < or equal to 2/10.   Baseline Patient does  not tolerate gaze exercises   Time 4   Period Weeks   Status Not Met     PT SHORT TERM GOAL #4   Title The patient will tolerate sit<>R sidelying without subjective reports of dizziness.   Baseline Target date 02/13/2016   Time 4   Period Weeks   Status Achieved           PT Long Term Goals - 01/15/16 2201      PT LONG TERM GOAL #1   Title The patient will be indep with progression of HEP.   Baseline Target date 03/15/2016   Time 8   Period Weeks     PT LONG TERM GOAL #2   Title The patient will report no dizziness with bed mobility tasks.   Baseline Target date 03/15/2016   Time 8   Period Weeks     PT LONG TERM GOAL #3   Title The patient will be further assessed for gait/balance deficits and goals to follow, as indicated.   Baseline Target date 03/15/2016   Time 8   Period Weeks               Plan - 02/19/16 1137    Clinical Impression Statement The patient has met 3 STGs.  She continues in therapy to be sensitive to movement and PT is progressing HEP to tolerance.  The patient appears to have 2 separate presenting symptoms.  One clinical presentation for dizziness is an episodic dizziness associated in time with migraines (either before or immediately after) + overall motion sensitivity.  PT is progressing at a slow pace due to h/o migraines and significant sensitivity to all motion.  Continue working to [employer].    PT Treatment/Interventions ADLs/Self Care Home Management;Canalith Repostioning;Vestibular;Patient/family education;Functional mobility training;Gait training;Neuromuscular re-education;Balance training;Therapeutic exercise;Therapeutic activities;Stair training   PT Next Visit Plan progress HEP, try gaze in clinic and add to HEP, check LTGs, determine d/c plan.   Consulted and Agree with Plan of Care Patient      Patient will benefit from skilled therapeutic intervention in order to improve the following deficits and impairments:  Abnormal gait,  Decreased balance, Dizziness, Difficulty walking  Visit Diagnosis: Dizziness and giddiness  Other abnormalities of gait and mobility     Problem List Patient Active Problem List   Diagnosis Date Noted  . Benign paroxysmal positional vertigo 12/16/2015  . Intractable chronic migraine without aura 09/18/2015  . Post concussive syndrome 02/26/2015  . Chronic  headaches 09/07/2012  . Palpitations 09/07/2012    Rashel Okeefe, PT 02/20/2016, 8:57 AM  [facility] [address] [place], [place], [address] Phone: [phone]   Fax:  [phone]  Name: Mallory Mills MRN: [id number] Date of Birth: [birth year]

## 2016-02-24 NOTE — Procedures (Signed)
   HISTORY: With history of migraine, dizziness, epilepsy disorder, on polypharmacy treatment.  TECHNIQUE:  16 channel EEG was performed based on standard 10-16 international system. One channel was dedicated to EKG, which has demonstrates regular but tachycardia, with rhythm of 102 beats per minutes.  Upon awakening, the posterior background activity was well-developed, in alpha range, 10 Hz, reactive to eye opening and closure.  There was no evidence of epileptiform discharge.  Photic stimulation was performed, which induced a symmetric photic driving.  Hyperventilation was performed, there was no abnormality elicit.  No sleep was achieved.  CONCLUSION: This is a  normal awake EEG.  There is no electrodiagnostic evidence of epileptiform discharge.  Levert FeinsteinYijun Zakiah Beckerman, M.D. Ph.D.  [facility] [address] [place], [place] [address] Phone: [phone] Fax:      [phone]

## 2016-02-25 ENCOUNTER — Telehealth: Payer: Self-pay | Admitting: *Deleted

## 2016-02-25 NOTE — Telephone Encounter (Signed)
Called and spoke to pt about normal EEG per Dr Lucia GaskinsAhern. She has no further questions at this time.

## 2016-02-25 NOTE — Telephone Encounter (Signed)
-----   Message from Anson FretAntonia B Ahern, MD sent at 02/25/2016  7:06 AM EDT ----- EEG normal thanks

## 2016-02-26 ENCOUNTER — Encounter: Payer: Self-pay | Admitting: Rehabilitative and Restorative Service Providers"

## 2016-03-04 ENCOUNTER — Ambulatory Visit: Payer: BLUE CROSS/BLUE SHIELD | Admitting: Rehabilitative and Restorative Service Providers"

## 2016-03-04 ENCOUNTER — Telehealth: Payer: Self-pay | Admitting: *Deleted

## 2016-03-04 DIAGNOSIS — R42 Dizziness and giddiness: Secondary | ICD-10-CM | POA: Diagnosis not present

## 2016-03-04 DIAGNOSIS — R2689 Other abnormalities of gait and mobility: Secondary | ICD-10-CM

## 2016-03-04 NOTE — Patient Instructions (Addendum)
FUNCTIONAL MOBILITY: Side Step    Step sideways for _10__ feet. Repeat in opposite direction.  BE NEAR A COUNTERTOP OR WALL AND HOLD AS NEEDED. _3-4 times up/down countertop or in hall. 1-2 times/day.  Copyright  VHI. All rights reserved.   Feet Apart, Varied Arm Positions - Eyes Closed    STAND WITH A CHAIR IN FRONT OF YOU AND USE 2 FINGER TOUCH for support. Stand with feet shoulder width apart and arms out. Close eyes and visualize upright position. Hold __10-20__ seconds. Repeat __3__ times per session. Do __1-2__ sessions per day. WORK UP TO 10 seconds.  Copyright  VHI. All rights reserved.   Tip Card  1.The goal of habituation training is to assist in decreasing symptoms of vertigo, dizziness, or nausea provoked by specific head and body motions. 2.These exercises may initially increase symptoms; however, be persistent and work through symptoms. With repetition and time, the exercises will assist in reducing or eliminating symptoms. 3.Exercises should be stopped and discussed with the therapist if you experience any of the following: - Sudden change or fluctuation in hearing - New onset of ringing in the ears, or increase in current intensity - Any fluid discharge from the ear - Severe pain in neck or back - Extreme nausea  Copyright  VHI. All rights reserved. Rolling    With pillow under head, start on back. Roll slowly to right. Hold position until symptoms subside. Roll slowly onto left side. Hold position until symptoms subside. Repeat sequence __3__ times per session. Do __2__ sessions per day.  Copyright  VHI. All rights reserved. Head Motion: Side to Side    Sitting, tilt head down slightly, slowly move head to right and then left with eyes open.  Repeat _5___ times per session. Do _2___ sessions per day.  Copyright  VHI. All rights reserved. Head Motion: Up and Down    Sitting, slowly move head up and down with eyes open.  Repeat _5___ times per  session. Do __2__ sessions per day.  Copyright  VHI. All rights reserved.  Walking Program:  Begin walking for exercise for , 2times/day, 5days/week.  Progress your walkingprogramby adding 1-44minutes to your routine each week, as tolerated. Be sure to wear good walking shoes, walk in a safe environment and only progress to your tolerance.  Gaze Stabilization: Tip Card  1.Target must remain in focus, not blurry, and appear stationary while head is in motion. 2.Perform exercises with small head movements (45 to either side of midline). 3.Increase speed of head motion so long as target is in focus. 4.If you wear eyeglasses, be sure you can see target through lens (therapist will give specific instructions for bifocal / progressive lenses). 5.These exercises may provoke dizziness or nausea. Work through these symptoms. If too dizzy, slow head movement slightly. Rest between each exercise. 6.Exercises demand concentration; avoid distractions. 7.For safety, perform standing exercises close to a counter, wall, corner, or next to someone.  Copyright  VHI. All rights reserved.   Gaze Stabilization: Sitting    Keeping eyes on target (held in hand at arms length away), tilt head down slightly and move head side to side for 3-5 reps.  *will want to increase your repetitions from 3-5, up to 10-20 as able---do this gradually over time. Do _2_ sessions per day.   Hold off on this exercise if it provokes headache.   Special Instructions: Exercises may bring on mild to moderate symptoms of dizziness, headaches, nausea that resolve within 30 minutes of completing  exercises. If symptoms are lasting longer than 30 minutes, modify your exercises by: >decreasing the # of times you complete each activity >ensuring your symptoms return to baseline before moving onto the next exercise >dividing up exercises so you do not do them all in one session, but multiple short sessions  throughout the day >doing them once a day until symptoms improve    Margretta DittyWEAVER,[employer], PT [facility] [address] [place], [place], [address] Phone: [phone]: [phone]

## 2016-03-04 NOTE — Therapy (Signed)
[facility] [address] [place], [place], [address] Phone: [phone]   Fax:  [phone]  Physical Therapy Treatment  Patient Details  Name: Mallory Mills MRN: [id number] Date of Birth: December 23, 1955 Referring Provider: Cecille Rubin, NP  Encounter Date: 03/04/2016      PT End of Session - 03/04/16 1027    Visit Number 6   Number of Visits 8   Date for PT Re-Evaluation 03/15/16   Authorization Type Private insurance 30 visit limit/year   PT Start Time 6237   PT Stop Time 1100   PT Time Calculation (min) 42 min   Equipment Utilized During Treatment Gait belt   Activity Tolerance Patient tolerated treatment well   Behavior During Therapy [facility] for tasks assessed/performed      Past Medical History:  Diagnosis Date  . Allergy   . Anxiety   . Depression   . Hypertension   . Kidney stones 2005  . Migraine   . Seizures ([place])   . Sleep apnea     Past Surgical History:  Procedure Laterality Date  . APPENDECTOMY  1982  . [place]   x2  . KNEE SURGERY  12/04/14  . TUBAL LIGATION  1982    There were no vitals filed for this visit.      Subjective Assessment - 03/04/16 1020    Subjective The patient had a fall yesterday upon waking and trying to get up quickly to go to restroom.  She reports EEG test was completed and results WNLs.   She continues to have memory changes noticed in day to day life.   The patient reports that she still continues with dizziness and imbalance during day to day tasks.    The patient continues with 3-4 migraines/week.  She had a migraines yesterday after falling and hitting her head.   "I can do the exercises and that does okay, but when I don't have a target to look at, I fall."   Pertinent History multiple concussions per subjective, migraines ("a good week is 3 times per week" lasting hours to days)   Patient Stated Goals Reduce dizziness because I am unable  to drive, headache reduction, my neck and back also bother me at times.     Currently in Pain? Yes   Pain Score 4    Pain Location --  shoulder, neck and low back (from fall)   Pain Descriptors / Indicators Aching   Pain Type Acute pain   Pain Onset Yesterday  from a fall   Pain Frequency Constant   Aggravating Factors  movement   Pain Relieving Factors pain meds                Vestibular Assessment - 03/04/16 1032      Vestibular Assessment   General Observation The patient walks independently without a device into clinic. "we've moved away from spinning and now I have lightheadedness."  Denies sensation of feeling like she could pass out.     Symptom Behavior   Type of Dizziness Lightheadedness     Positional Testing   Sidelying Test Sidelying Right;Sidelying Left   Horizontal Canal Testing Horizontal Canal Right;Horizontal Canal Left     Sidelying Right   Sidelying Right Duration no increase from baseline of 2-3/10   Sidelying Right Symptoms No nystagmus     Sidelying Left   Sidelying Left Duration mild increase to 3/10   Sidelying Left Symptoms No nystagmus  Horizontal Canal Right   Horizontal Canal Right Duration mild dizziness noted 3/10   Horizontal Canal Right Symptoms Normal     Horizontal Canal Left   Horizontal Canal Left Duration none   Horizontal Canal Left Symptoms Normal                  Vestibular Treatment/Exercise - 03/04/16 1034      Vestibular Treatment/Exercise   Vestibular Treatment Provided Gaze;Habituation   Habituation Exercises Horizontal Roll;Seated Horizontal Head Turns;Seated Vertical Head Turns   Gaze Exercises X1 Viewing Horizontal     Horizontal Roll   Number of Reps  2   Symptom Description  reviewed for HEP     Seated Horizontal Head Turns   Number of Reps  5   Symptom Description  minimal/trace amount of dizziness noted      Seated Vertical Head Turns   Number of Reps  5   Symptom Description   minimal/trace amount of dizziness noted     X1 Viewing Horizontal   Foot Position seated   Comments slow gaze x 3-5 reps seated without significant increase in symptoms.      Reviewed balance HEP and discussed safety in the home when performing.         PT Education - 03/04/16 1133    Education provided Yes   Education Details reviewed all HEP and discussed plan for progression   Person(s) Educated Patient   Methods Explanation;Demonstration;Handout   Comprehension Verbalized understanding;Returned demonstration          PT Short Term Goals - 02/19/16 1119      PT SHORT TERM GOAL #1   Title The patient will be indep with HEP for gaze adaptation, habituation, balance and general mobility.   Baseline Met on 01/31/2016   Time 4   Period Weeks   Status Achieved     PT SHORT TERM GOAL #2   Title The patient will have further positional testing complete as tolerated and goals to follow, as indicated.   Baseline Met on 01/20/2016 with motion sensitivity with positional teesting, but no true nystagmus noted in room light.    Time 4   Period Weeks   Status Achieved     PT SHORT TERM GOAL #3   Title The patient will tolerate gaze x 1 adaptation exercises x 30 seconds with dizziness < or equal to 2/10.   Baseline Patient does not tolerate gaze exercises   Time 4   Period Weeks   Status Not Met     PT SHORT TERM GOAL #4   Title The patient will tolerate sit<>R sidelying without subjective reports of dizziness.   Baseline Target date 02/13/2016   Time 4   Period Weeks   Status Achieved           PT Long Term Goals - 03/04/16 1026      PT LONG TERM GOAL #1   Title The patient will be indep with progression of HEP.   Baseline Target date 03/15/2016   Time 8   Period Weeks   Status Achieved     PT LONG TERM GOAL #2   Title The patient will report no dizziness with bed mobility tasks.   Baseline The patient reports that she has 2-3/10 baseline symptoms.  With bed  mobility, symptoms are at a 3/10.     Time 8   Period Weeks   Status Not Met     PT LONG TERM GOAL #3  Title The patient will be further assessed for gait/balance deficits and goals to follow, as indicated.   Baseline Target date 03/15/2016   Time 8   Period Weeks   Status Achieved               Plan - 03/04/16 1138    Clinical Impression Statement The patient has partially met LTGs.  She is able to perform HEP (working on gaze adaptation for VOR, head and body movements for habituation, balance activities) independently.  PT recommended she journal falls as at this time there does not seem to be any prediction or consistency to occasional falls.  PT has educated on using vision for balance and moving slowly during transitional movements.  Patient feels overall "spinning" senation has improved and what she notes at this time is more of a "lightheaded" sensation.   PT Treatment/Interventions ADLs/Self Care Home Management;Canalith Repostioning;Vestibular;Patient/family education;Functional mobility training;Gait training;Neuromuscular re-education;Balance training;Therapeutic exercise;Therapeutic activities;Stair training   PT Next Visit Plan Discharge with HEP.    Consulted and Agree with Plan of Care Patient      Patient will benefit from skilled therapeutic intervention in order to improve the following deficits and impairments:  Abnormal gait, Decreased balance, Dizziness, Difficulty walking  Visit Diagnosis: Dizziness and giddiness  Other abnormalities of gait and mobility     Problem List Patient Active Problem List   Diagnosis Date Noted  . Benign paroxysmal positional vertigo 12/16/2015  . Intractable chronic migraine without aura 09/18/2015  . Post concussive syndrome 02/26/2015  . Chronic headaches 09/07/2012  . Palpitations 09/07/2012   PHYSICAL THERAPY DISCHARGE SUMMARY  Visits from Start of Care: 6  Current functional level related to goals / functional  outcomes: See above   Remaining deficits: Continued intermittent imbalance (varies day to day) Lightheaded sensation   Education / Equipment: HEP, home safety, progression of walking program.  Plan: Patient agrees to discharge.  Patient goals were partially met. Patient is being discharged due to meeting the stated rehab goals.  ?????        Thank you for the referral of this patient. Rudell Cobb, MPT    [place], PT 03/04/2016, 11:50 AM  [facility] [address] [place], [place], [address] Phone: [phone]   Fax:  [phone]  Name: Mallory Mills MRN: [id number] Date of Birth: August 28, 1955

## 2016-03-04 NOTE — Telephone Encounter (Signed)
Called and LVM for pt to call back. Need more clarification on what patch she is requesting. Will have to ask Dr Lucia GaskinsAhern if okay.   As far as the pamelor, rx was sent on 02/14/16 with 6 refills to pharmacy. She can contact them. She should have refill to pick up.

## 2016-03-06 NOTE — Telephone Encounter (Signed)
Called and spoke to pt. She stated she did not receive my message.   Advised I needed more information about what she is asking for for the patch. She stated she was told there is a patch to help with vertigo. Advised I will discuss w/ Dr Lucia GaskinsAhern and call her back Monday to advise. She verbalized understanding.  She stated she has refills on her pamelor. Nothing further needed at this time for this.    I spoke to Dr Lucia GaskinsAhern. She can call in rx scopolamine patch for pt.

## 2016-03-08 ENCOUNTER — Other Ambulatory Visit: Payer: Self-pay | Admitting: Neurology

## 2016-03-08 MED ORDER — NORTRIPTYLINE HCL 25 MG PO CAPS: 50.0000 mg | ORAL_CAPSULE | Freq: Every day | ORAL | 4 refills | Status: AC

## 2016-03-08 MED ORDER — SCOPOLAMINE 1 MG/3DAYS TD PT72: 1.0000 | MEDICATED_PATCH | TRANSDERMAL | 1 refills | Status: DC

## 2016-03-08 NOTE — Telephone Encounter (Signed)
Ordered/ Patient has tramadol on her med list. Let her know she shouldn;t be taking it with her history of seizures, it decreases seizure threshold thanks

## 2016-03-09 NOTE — Telephone Encounter (Signed)
Thanks, I did call in the scopolamine patch

## 2016-03-09 NOTE — Telephone Encounter (Signed)
Called and LVM for pt relaying Dr Lucia GaskinsAhern message. Gave [facility] phone number if she has further questions.

## 2016-03-09 NOTE — Telephone Encounter (Signed)
Dr Lucia GaskinsAhern- FYI  Called patient back. Advised again that tramadol can decrease her seizure threshold and put her at more increased risk for having a seizure. She states her PCP advised her to take this first if she felt like she had a headache and then take maxalt if it was a migraine.  I educated pt that taking narcotic can cause rebound headaches as well. I encouraged pt to try maxalt at the onset of migraine. She can repeat dose x1. She should not take more than 2 in 24 hours or 2-3 doses in a week. She verbalized understanding. She will also discuss with her PCP next week. She had an appt on 03/16/16. Advised her to call if she has any further questions/concerns.

## 2016-03-09 NOTE — Telephone Encounter (Signed)
Pt called in asking what can she take for her migraines now. Please call and advise

## 2016-05-15 ENCOUNTER — Other Ambulatory Visit: Payer: Self-pay | Admitting: *Deleted

## 2016-05-15 DIAGNOSIS — G43719 Chronic migraine without aura, intractable, without status migrainosus: Secondary | ICD-10-CM

## 2016-05-15 MED ORDER — ONABOTULINUMTOXINA 100 UNITS IJ SOLR: INTRAMUSCULAR | 3 refills | Status: AC

## 2016-05-21 ENCOUNTER — Ambulatory Visit (INDEPENDENT_AMBULATORY_CARE_PROVIDER_SITE_OTHER): Payer: BLUE CROSS/BLUE SHIELD | Admitting: Neurology

## 2016-05-21 ENCOUNTER — Encounter: Payer: Self-pay | Admitting: Neurology

## 2016-05-21 VITALS — BP 136/81 | HR 87 | Ht 63.0 in

## 2016-05-21 DIAGNOSIS — G43719 Chronic migraine without aura, intractable, without status migrainosus: Secondary | ICD-10-CM | POA: Diagnosis not present

## 2016-05-21 NOTE — Progress Notes (Signed)
Botox-100unitsx2 vials Lot: [id number] Expiration: 11/2018 NDC: [id number]  0.9% Sodium Chloride bacteriostatic- 4mL total Lot: [id number] Expiration: 10/09/2017 NDC: [id number]  Dx: V25.366G43.719 B/B

## 2016-05-21 NOTE — Progress Notes (Signed)

## 2016-06-23 ENCOUNTER — Other Ambulatory Visit: Payer: Self-pay | Admitting: Sports Medicine

## 2016-06-23 DIAGNOSIS — M25511 Pain in right shoulder: Secondary | ICD-10-CM

## 2016-06-24 ENCOUNTER — Ambulatory Visit
Admission: RE | Admit: 2016-06-24 | Discharge: 2016-06-24 | Disposition: A | Discharge status: Home / Self Care | Payer: BLUE CROSS/BLUE SHIELD | Source: Ambulatory Visit | Attending: Sports Medicine | Admitting: Sports Medicine

## 2016-06-24 DIAGNOSIS — M25511 Pain in right shoulder: Secondary | ICD-10-CM

## 2016-06-25 DIAGNOSIS — Z0289 Encounter for other administrative examinations: Secondary | ICD-10-CM

## 2016-06-25 DIAGNOSIS — Z0271 Encounter for disability determination: Secondary | ICD-10-CM

## 2016-07-02 ENCOUNTER — Telehealth: Payer: Self-pay | Admitting: Neurology

## 2016-07-02 NOTE — Telephone Encounter (Signed)
Patient called office in reference to having shoulder surgery March 6th, Dr. Renaye Rakersim Murphy they will be faxing over surgery clearance paperwork.  Please call

## 2016-07-03 ENCOUNTER — Other Ambulatory Visit: Payer: Self-pay | Admitting: Orthopedic Surgery

## 2016-07-03 DIAGNOSIS — M25511 Pain in right shoulder: Secondary | ICD-10-CM

## 2016-07-06 DIAGNOSIS — F329 Major depressive disorder, single episode, unspecified: Secondary | ICD-10-CM

## 2016-07-06 DIAGNOSIS — R569 Unspecified convulsions: Secondary | ICD-10-CM

## 2016-07-06 DIAGNOSIS — M19011 Primary osteoarthritis, right shoulder: Secondary | ICD-10-CM

## 2016-07-06 DIAGNOSIS — I10 Essential (primary) hypertension: Secondary | ICD-10-CM

## 2016-07-06 DIAGNOSIS — F32A Depression, unspecified: Secondary | ICD-10-CM

## 2016-07-06 DIAGNOSIS — G4730 Sleep apnea, unspecified: Secondary | ICD-10-CM

## 2016-07-06 DIAGNOSIS — G43909 Migraine, unspecified, not intractable, without status migrainosus: Secondary | ICD-10-CM

## 2016-07-06 NOTE — H&P (Signed)
PREOPERATIVE H&P Patient ID: Mallory Mills MRN: [id number] DOB/AGE: [birth year] 61 y.o.  Chief Complaint: OA RIGHT SHOULDER  Planned Procedure Date: 07/14/16 Medical and Cardiac Clearance by Dr. Selena Batten   Neuro Clearance by Dr. Lucia Gaskins pending   HPI: Mallory Mills is a 61 y.o. female with a history of falls, seizures (most recent 2006), migraines, depression, HTN, and OSA who presents for evaluation of OA RIGHT SHOULDER. The patient has a history of pain and functional disability in the right shoulder due to arthritis and has failed non-surgical conservative treatments for greater than 12 weeks to include NSAID's and/or analgesics, corticosteriod injections and activity modification.  Onset of symptoms was gradual, starting 5 years ago with gradually worsening course since that time.  Patient currently rates pain at 9 out of 10 with activity. Patient has night pain, worsening of pain with activity and weight bearing, pain that interferes with activities of daily living and pain with passive range of motion.  Patient has evidence of severe rotator cuff deficient glenohumeral arthritis.  Severe Glenohumeral OA seen on MRI.Marland Kitchen There is no active infection.  Past Medical History:  Diagnosis Date  . Allergy   . Anxiety   . Depression   . Hypertension   . Kidney stones 2005  . Migraine   . Seizures (HCC)   . Sleep apnea    Past Surgical History:  Procedure Laterality Date  . APPENDECTOMY  1982  . CESAREAN SECTION  1978 & 1982   x2  . KNEE SURGERY  12/04/14  . TUBAL LIGATION  1982   Allergies  Allergen Reactions  . Aspirin Itching  . Augmentin [Amoxicillin-Pot Clavulanate] Diarrhea    Interferes with seizure medication  Has patient had a PCN reaction causing immediate rash, facial/tongue/throat swelling, SOB or lightheadedness with hypotension: No Has patient had a PCN reaction causing severe rash involving mucus membranes or skin necrosis: No Has patient had a PCN reaction that required  hospitalization No Has patient had a PCN reaction occurring within the last 10 years: Yes If all of the above answers are "NO", then may proceed with Cephalosporin use.   . Codeine Itching  . Vicodin [Hydrocodone-Acetaminophen] Itching   Prior to Admission medications   Medication Sig Start Date End Date Taking? Authorizing Provider  acetaminophen (TYLENOL) 500 MG tablet Take 1,000 mg by mouth every 4 (four) hours as needed for headache (pain).   Yes Historical Provider, MD  botulinum toxin Type A (BOTOX) 100 units SOLR injection Inject into the head and neck muscles every 3 months by provider in the office 05/15/16  Yes Anson Fret, MD  clonazePAM (KLONOPIN) 1 MG tablet Take 1 mg by mouth daily.    Yes Historical Provider, MD  fexofenadine (ALLEGRA) 180 MG tablet Take 180 mg by mouth daily.   Yes Historical Provider, MD  fluticasone (FLONASE) 50 MCG/ACT nasal spray Place 1 spray into both nostrils daily as needed for allergies or rhinitis (congestion).    Yes Historical Provider, MD  gabapentin (NEURONTIN) 300 MG capsule Take 2 capsules (600 mg total) by mouth 3 (three) times daily. 09/17/15  Yes Anson Fret, MD  lamoTRIgine (LAMICTAL) 100 MG tablet Take 100-200 mg by mouth See admin instructions. Take 1 tablet (100 mg) by mouth every morning and 2 tablets (200 mg) at bedtime   Yes Historical Provider, MD  meclizine (ANTIVERT) 25 MG tablet Take 1 tablet (25 mg total) by mouth 3 (three) times daily as needed for dizziness. 12/13/15  Yes Loraine Leriche  Fayrene FearingJames, MD  metoprolol (LOPRESSOR) 50 MG tablet Take 50 mg by mouth 2 (two) times daily.   Yes Historical Provider, MD  Multiple Vitamins-Minerals (ICAPS AREDS 2) CAPS Take 1 tablet by mouth daily. On hold prior to procedure   Yes Historical Provider, MD  nortriptyline (PAMELOR) 25 MG capsule Take 2 capsules (50 mg total) by mouth at bedtime. Patient taking differently: Take 25 mg by mouth at bedtime.  03/08/16  Yes Anson FretAntonia B Ahern, MD  ondansetron (ZOFRAN  ODT) 4 MG disintegrating tablet Take 1-2 at the onset of headache or dizziness. May repeat in 2 hours. May take with maxalt or ultram. Patient taking differently: Take 4 mg by mouth 2 (two) times daily as needed for nausea (migraines). May repeat in 2 hours. May take with maxalt or ultram. 06/03/15  Yes Anson FretAntonia B Ahern, MD  OVER THE COUNTER MEDICATION Place 1 drop into both eyes 2 (two) times daily as needed (dry eyes). Over the counter lubricating eye drop   Yes Historical Provider, MD  ranitidine (ZANTAC) 150 MG tablet Take 150 mg by mouth 2 (two) times daily.   Yes Historical Provider, MD  rizatriptan (MAXALT) 10 MG tablet Take 10 mg by mouth See admin instructions. Take 1 tablet (10 mg) by mouth as needed for migraines, may repeat once in 2 hours if needed   Yes Historical Provider, MD  scopolamine (TRANSDERM-SCOP) 1 MG/3DAYS Place 1 patch (1.5 mg total) onto the skin every 3 (three) days. Patient taking differently: Place 1 patch onto the skin every 3 (three) days as needed.  03/08/16  Yes Anson FretAntonia B Ahern, MD  traMADol (ULTRAM) 50 MG tablet Take 50 mg by mouth 2 (two) times daily as needed (back pain/ migraine).    Yes Historical Provider, MD  valsartan-hydrochlorothiazide (DIOVAN-HCT) 320-12.5 MG tablet Take 1 tablet by mouth daily.   Yes Historical Provider, MD  venlafaxine XR (EFFEXOR-XR) 150 MG 24 hr capsule Take 150 mg by mouth at bedtime.   Yes Historical Provider, MD  zolpidem (AMBIEN) 10 MG tablet Take 10 mg by mouth at bedtime.    Yes Historical Provider, MD  Cholecalciferol (VITAMIN D3) 5000 units TABS Take 5,000 Units by mouth 2 (two) times daily. On hold prior to procedure    Historical Provider, MD  Multiple Vitamin (MULTIVITAMIN WITH MINERALS) TABS tablet Take 1 tablet by mouth at bedtime. On hold prior to procedure    Historical Provider, MD  venlafaxine XR (EFFEXOR XR) 75 MG 24 hr capsule Take 1 capsule (75 mg total) by mouth daily with breakfast. Patient not taking: Reported on  07/02/2016 06/03/15   Anson FretAntonia B Ahern, MD   Social History   Social History  . Marital status: Divorced    Spouse name: N/A  . Number of children: 2  . Years of education: 12+   Occupational History  . Administration Unemployed  . [employer] [place]     Social History Main Topics  . Smoking status: Never Smoker  . Smokeless tobacco: Never Used  . Alcohol use Yes     Comment: 1-2/month  . Drug use: No  . Sexual activity: Not on file   Other Topics Concern  . Not on file   Social History Narrative   Lives at home with brother   Caffeine use: 1 coffee or soft drink per day   Family History  Problem Relation Age of Onset  . Cancer Mother     lung  . Hypertension Mother   . Cancer Maternal  Aunt     breast  . Cancer Paternal Aunt     breast  . Heart disease Father   . Hypertension Father   . Gout Son   . Heart disease Maternal Grandmother     ROS: Currently denies lightheadedness, dizziness, Fever, chills, CP, SOB.   No personal history of DVT, PE, MI, or CVA. No loose teeth or dentures All other systems have been reviewed and were otherwise currently negative with the exception of those mentioned in the HPI and as above.  Objective: Vitals: Ht: 5'3" Wt: 211 Temp: 98 BP: 107/69 Pulse: 91 O2 95% on room air. Physical Exam: General: Alert, NAD.   HEENT: EOMI, Good Neck Extension Pulm: No increased work of breathing.  Clear B/L A/P w/o crackle or wheeze.  CV: RRR, No m/g/r appreciated  GI: soft, NT, ND Neuro: Neuro without gross focal deficit.  Sensation intact distally Skin: No lesions in the area of chief complaint MSK/Surgical Site: Right shoulder w/o erythema or sign of infection.  Pain w/ ROM limited to about 85 deg ff/abd.  NVI / sensation intact distally.  Imaging Review Plain films demonstrate severe rotator cuff deficient glenohumeral arthritis.  Severe Glenohumeral OA seen on MRI.  Assessment: OA RIGHT SHOULDER Principal Problem:   Localized  osteoarthritis of right shoulder Active Problems:   Chronic headaches   Seizures (HCC)   Migraine   Depression   Hypertension   Sleep apnea   Plan: Plan for Procedure(s): TOTAL SHOULDER ARTHROPLASTY  The patient history, physical exam, clinical judgement of the provider and imaging are consistent with end stage degenerative joint disease and total joint arthroplasty is deemed medically necessary. The treatment options including medical management, injection therapy, and arthroplasty were discussed at length. The risks and benefits of Procedure(s): TOTAL SHOULDER ARTHROPLASTY were presented and reviewed.  The risks of nonoperative treatment, versus surgical intervention including but not limited to continued pain, aseptic loosening, stiffness, dislocation/subluxation, infection, bleeding, nerve injury, blood clots, cardiopulmonary complications, morbidity, mortality, among others were discussed. The patient verbalizes understanding and wishes to proceed with the plan.  Patient is being admitted for inpatient treatment for surgery, pain control, PT, OT, prophylactic antibiotics, VTE prophylaxis, progressive ambulation, ADL's and discharge planning.   Dental prophylaxis discussed and recommended for 2 years postoperatively.  ASA 81 mg will be used postoperatively for DVT prophylaxis in addition to SCDs, and early ambulation. The patient is planning to be discharged home in care of friend Kriste Basque.  Brother will be at home looking after her.  Lucretia Kern Martensen III, PA-C 07/06/2016 1:05 PM

## 2016-07-07 ENCOUNTER — Ambulatory Visit
Admission: RE | Admit: 2016-07-07 | Discharge: 2016-07-07 | Disposition: A | Discharge status: Home / Self Care | Payer: BLUE CROSS/BLUE SHIELD | Source: Ambulatory Visit | Attending: Orthopedic Surgery | Admitting: Orthopedic Surgery

## 2016-07-07 DIAGNOSIS — M25511 Pain in right shoulder: Secondary | ICD-10-CM

## 2016-07-07 NOTE — Telephone Encounter (Signed)
Pre-op clearance reviewed/signed by Dr. Lucia GaskinsAhern. Faxed back to Dr. Greig RightMurphy's office (F # [phone]). Pt notified via TC.

## 2016-07-08 ENCOUNTER — Encounter (HOSPITAL_COMMUNITY)
Admission: RE | Admit: 2016-07-08 | Discharge: 2016-07-08 | Disposition: A | Discharge status: Home / Self Care | Payer: BLUE CROSS/BLUE SHIELD | Source: Ambulatory Visit | Attending: Orthopedic Surgery | Admitting: Orthopedic Surgery

## 2016-07-08 ENCOUNTER — Encounter (HOSPITAL_COMMUNITY): Payer: Self-pay

## 2016-07-08 DIAGNOSIS — M19011 Primary osteoarthritis, right shoulder: Secondary | ICD-10-CM | POA: Diagnosis not present

## 2016-07-08 DIAGNOSIS — Z01812 Encounter for preprocedural laboratory examination: Secondary | ICD-10-CM | POA: Insufficient documentation

## 2016-07-08 HISTORY — DX: Unspecified macular degeneration: H35.30

## 2016-07-08 HISTORY — DX: Pneumonia, unspecified organism: J18.9

## 2016-07-08 HISTORY — DX: Cardiac arrhythmia, unspecified: I49.9

## 2016-07-08 HISTORY — DX: Anemia, unspecified: D64.9

## 2016-07-08 HISTORY — DX: Inflammatory liver disease, unspecified: K75.9

## 2016-07-08 HISTORY — DX: Unspecified osteoarthritis, unspecified site: M19.90

## 2016-07-08 HISTORY — DX: Personal history of urinary calculi: Z87.442

## 2016-07-08 LAB — BASIC METABOLIC PANEL
Anion gap: 12 (ref 5–15)
BUN: 11 mg/dL (ref 6–20)
CALCIUM: 9.5 mg/dL (ref 8.9–10.3)
CHLORIDE: 104 mmol/L (ref 101–111)
CO2: 23 mmol/L (ref 22–32)
CREATININE: 0.99 mg/dL (ref 0.44–1.00)
GFR calc Af Amer: >60 mL/min (ref >60)
GFR calc non Af Amer: >60 mL/min (ref >60)
GLUCOSE: 168 mg/dL — AB (ref 65–99)
Potassium: 3.3 mmol/L — ABNORMAL LOW (ref 3.5–5.1)
Sodium: 139 mmol/L (ref 135–145)

## 2016-07-08 LAB — CBC
HCT: 42.2 % (ref 36.0–46.0)
Hemoglobin: 13.5 g/dL (ref 12.0–15.0)
MCH: 27.9 pg (ref 26.0–34.0)
MCHC: 32 g/dL (ref 30.0–36.0)
MCV: 87.2 fL (ref 78.0–100.0)
PLATELETS: 272 10*3/uL (ref 150–400)
RBC: 4.84 MIL/uL (ref 3.87–5.11)
RDW: 14.5 % (ref 11.5–15.5)
WBC: 8.7 10*3/uL (ref 4.0–10.5)

## 2016-07-08 LAB — SURGICAL PCR SCREEN
MRSA, PCR: NEGATIVE
Staphylococcus aureus: NEGATIVE

## 2016-07-08 NOTE — Progress Notes (Signed)
Pt denies cardiac history except for an irregular heart rate that "comes and goes". She states it's never been a problem, was told most people had it at sometime in their life. Pt states she is not diabetic.

## 2016-07-08 NOTE — Pre-Procedure Instructions (Addendum)
Delila SpenceMary Ann Governale  07/08/2016    Your procedure is scheduled on July 14, 2016 at 7:30 AM.   [facility] at 5:30 AM.   Call this number if you have problems the morning of surgery: [phone]   Questions prior to day of surgery, please call [phone] between 8 & 4 PM.   Remember:  Do not eat food or drink liquids after midnight Monday, 07/13/16.  Take these medicines the morning of surgery with A SIP OF WATER: Clonazepam, Gabapentin, Lamictal, Metoprolol, Zantac, Venlafaxine, Tylenol or Tramadol -  if needed for pain, migraine meds - if needed  STOP all Herbal meds, NSAIDS (aleve,naproxen,advil,ibuprofen) as of today including all vitamins/supplements and aspirin   Do not wear jewelry, make-up or nail polish.  Do not wear lotions, powders, perfumes or deodorant.  Do not shave 48 hours prior to surgery.   Do not bring valuables to the hospital.  [facility] is not responsible for any belongings or valuables.  Contacts, dentures or bridgework may not be worn into surgery.  Leave your suitcase in the car.  After surgery it may be brought to your room.  For patients admitted to the hospital, discharge time will be determined by your treatment team.  Patients discharged the day of surgery will not be allowed to drive home.   Special Instructions: [place] - Preparing for Surgery  Before surgery, you can play an important role.  Because skin is not sterile, your skin needs to be as free of germs as possible.  You can reduce the number of germs on you skin by washing with CHG (chlorahexidine gluconate) soap before surgery.  CHG is an antiseptic cleaner which kills germs and bonds with the skin to continue killing germs even after washing.  Please DO NOT use if you have an allergy to CHG or antibacterial soaps.  If your skin becomes reddened/irritated stop using the CHG and inform your nurse when you arrive at Short Stay.  Do not shave (including legs  and underarms) for at least 48 hours prior to the first CHG shower.  You may shave your face.  Please follow these instructions carefully:   1.  Shower with CHG Soap the night before surgery and the morning of Surgery.  2.  If you choose to wash your hair, wash your hair first as usual with your normal shampoo.  3.  After you shampoo, rinse your hair and body thoroughly to remove the Shampoo.  4.  Use CHG as you would any other liquid soap.  You can apply chg directly  to the skin and wash gently with scrungie or a clean washcloth.  5.  Apply the CHG Soap to your body ONLY FROM THE NECK DOWN.  Do not use on open wounds or open sores.  Avoid contact with your eyes ears, mouth and genitals (private parts).  Wash genitals (private parts)       with your normal soap.  6.  Wash thoroughly, paying special attention to the area where your surgery will be performed.  7.  Thoroughly rinse your body with warm water from the neck down.  8.  DO NOT shower/wash with your normal soap after using and rinsing off the CHG Soap.  9.  Pat yourself dry with a clean towel.            10.  Wear clean pajamas.            11.  Place clean sheets on your bed the night of your first shower and do not sleep with pets.  Day of Surgery  Do not apply any lotions/deodorants the morning of surgery.  Please wear clean clothes to the hospital.  Please read over the fact sheets that you were given.

## 2016-07-09 NOTE — Progress Notes (Signed)
Pt called today wanting to know what her blood sugar was yesterday. I told her it was 168. She asked if that was going to get her surgery cancelled and I told her no.

## 2016-07-13 NOTE — Anesthesia Preprocedure Evaluation (Signed)
Anesthesia Evaluation  Patient identified by MRN, date of birth, ID band Patient awake    Reviewed: Allergy & Precautions, H&P , Patient's Chart, lab work & pertinent test results, reviewed documented beta blocker date and time   Airway Mallampati: II  TM Distance: >3 FB Neck ROM: full    Dental no notable dental hx.    Pulmonary    Pulmonary exam normal breath sounds clear to auscultation       Cardiovascular hypertension,  Rhythm:regular Rate:Normal     Neuro/Psych    GI/Hepatic   Endo/Other    Renal/GU      Musculoskeletal   Abdominal   Peds  Hematology   Anesthesia Other Findings   Reproductive/Obstetrics                             Anesthesia Physical Anesthesia Plan  ASA: III  Anesthesia Plan: General   Post-op Pain Management:  Regional for Post-op pain   Induction: Intravenous  Airway Management Planned: Oral ETT  Additional Equipment:   Intra-op Plan:   Post-operative Plan: Extubation in OR  Informed Consent: I have reviewed the patients History and Physical, chart, labs and discussed the procedure including the risks, benefits and alternatives for the proposed anesthesia with the patient or authorized representative who has indicated his/her understanding and acceptance.   Dental Advisory Given and Dental advisory given  Plan Discussed with: CRNA and Surgeon  Anesthesia Plan Comments: (  Discussed general anesthesia, including possible nausea, instrumentation of airway, sore throat,pulmonary aspiration, etc. I asked if the were any outstanding questions, or  concerns before we proceeded.)        Anesthesia Quick Evaluation

## 2016-07-14 ENCOUNTER — Encounter (HOSPITAL_COMMUNITY): Payer: Self-pay | Admitting: *Deleted

## 2016-07-14 ENCOUNTER — Ambulatory Visit (HOSPITAL_COMMUNITY): Payer: BLUE CROSS/BLUE SHIELD | Admitting: Anesthesiology

## 2016-07-14 ENCOUNTER — Observation Stay (HOSPITAL_COMMUNITY)
Admission: AD | Admit: 2016-07-14 | Discharge: 2016-07-15 | DRG: 483 | Disposition: A | Discharge status: Home / Self Care | Payer: BLUE CROSS/BLUE SHIELD | Source: Ambulatory Visit | Attending: Orthopedic Surgery | Admitting: Orthopedic Surgery

## 2016-07-14 ENCOUNTER — Inpatient Hospital Stay (HOSPITAL_COMMUNITY): Payer: BLUE CROSS/BLUE SHIELD

## 2016-07-14 ENCOUNTER — Encounter (HOSPITAL_COMMUNITY)
Admission: AD | Disposition: A | Discharge status: Home / Self Care | Payer: Self-pay | Source: Ambulatory Visit | Attending: Orthopedic Surgery

## 2016-07-14 DIAGNOSIS — F329 Major depressive disorder, single episode, unspecified: Secondary | ICD-10-CM | POA: Diagnosis not present

## 2016-07-14 DIAGNOSIS — Z87442 Personal history of urinary calculi: Secondary | ICD-10-CM | POA: Diagnosis not present

## 2016-07-14 DIAGNOSIS — Z885 Allergy status to narcotic agent status: Secondary | ICD-10-CM | POA: Diagnosis not present

## 2016-07-14 DIAGNOSIS — Z8619 Personal history of other infectious and parasitic diseases: Secondary | ICD-10-CM | POA: Diagnosis not present

## 2016-07-14 DIAGNOSIS — I10 Essential (primary) hypertension: Secondary | ICD-10-CM | POA: Diagnosis not present

## 2016-07-14 DIAGNOSIS — Z801 Family history of malignant neoplasm of trachea, bronchus and lung: Secondary | ICD-10-CM | POA: Insufficient documentation

## 2016-07-14 DIAGNOSIS — R519 Headache, unspecified: Secondary | ICD-10-CM

## 2016-07-14 DIAGNOSIS — R51 Headache: Secondary | ICD-10-CM

## 2016-07-14 DIAGNOSIS — Z886 Allergy status to analgesic agent status: Secondary | ICD-10-CM | POA: Diagnosis not present

## 2016-07-14 DIAGNOSIS — Z803 Family history of malignant neoplasm of breast: Secondary | ICD-10-CM | POA: Diagnosis not present

## 2016-07-14 DIAGNOSIS — Z881 Allergy status to other antibiotic agents status: Secondary | ICD-10-CM | POA: Insufficient documentation

## 2016-07-14 DIAGNOSIS — R569 Unspecified convulsions: Secondary | ICD-10-CM | POA: Diagnosis not present

## 2016-07-14 DIAGNOSIS — Z8249 Family history of ischemic heart disease and other diseases of the circulatory system: Secondary | ICD-10-CM

## 2016-07-14 DIAGNOSIS — G43909 Migraine, unspecified, not intractable, without status migrainosus: Secondary | ICD-10-CM | POA: Diagnosis not present

## 2016-07-14 DIAGNOSIS — I499 Cardiac arrhythmia, unspecified: Secondary | ICD-10-CM | POA: Insufficient documentation

## 2016-07-14 DIAGNOSIS — M19011 Primary osteoarthritis, right shoulder: Principal | ICD-10-CM | POA: Diagnosis present

## 2016-07-14 DIAGNOSIS — Z8349 Family history of other endocrine, nutritional and metabolic diseases: Secondary | ICD-10-CM | POA: Diagnosis not present

## 2016-07-14 DIAGNOSIS — F32A Depression, unspecified: Secondary | ICD-10-CM

## 2016-07-14 DIAGNOSIS — G4733 Obstructive sleep apnea (adult) (pediatric): Secondary | ICD-10-CM | POA: Diagnosis not present

## 2016-07-14 DIAGNOSIS — G4730 Sleep apnea, unspecified: Secondary | ICD-10-CM | POA: Diagnosis not present

## 2016-07-14 DIAGNOSIS — F419 Anxiety disorder, unspecified: Secondary | ICD-10-CM | POA: Diagnosis not present

## 2016-07-14 DIAGNOSIS — Z888 Allergy status to other drugs, medicaments and biological substances status: Secondary | ICD-10-CM | POA: Diagnosis not present

## 2016-07-14 DIAGNOSIS — Z79899 Other long term (current) drug therapy: Secondary | ICD-10-CM

## 2016-07-14 HISTORY — PX: TOTAL SHOULDER ARTHROPLASTY: SHX126

## 2016-07-14 SURGERY — ARTHROPLASTY, SHOULDER, TOTAL
Anesthesia: General | Laterality: Right

## 2016-07-14 MED ORDER — SENNA 8.6 MG PO TABS
1.0000 | ORAL_TABLET | Freq: Two times a day (BID) | ORAL | Status: DC
  Administered 2016-07-14 – 2016-07-15 (×3): 8.6 mg via ORAL
  Filled 2016-07-14 (×3): qty 1

## 2016-07-14 MED ORDER — OXYCODONE HCL 5 MG PO TABS
5.0000 mg | ORAL_TABLET | ORAL | Status: DC | PRN
  Administered 2016-07-14 – 2016-07-15 (×5): 10 mg via ORAL
  Filled 2016-07-14 (×5): qty 2

## 2016-07-14 MED ORDER — ONDANSETRON HCL 4 MG/2ML IJ SOLN
INTRAMUSCULAR | Status: DC | PRN
  Administered 2016-07-14: 4 mg via INTRAVENOUS

## 2016-07-14 MED ORDER — ENOXAPARIN SODIUM 40 MG/0.4ML ~~LOC~~ SOLN
40.0000 mg | SUBCUTANEOUS | Status: DC
  Administered 2016-07-15: 40 mg via SUBCUTANEOUS
  Filled 2016-07-14: qty 0.4

## 2016-07-14 MED ORDER — LACTATED RINGERS IV SOLN
INTRAVENOUS | Status: DC
  Administered 2016-07-14 (×2): via INTRAVENOUS

## 2016-07-14 MED ORDER — MORPHINE SULFATE (PF) 2 MG/ML IV SOLN: 2.0000 mg | INTRAVENOUS | Status: DC | PRN

## 2016-07-14 MED ORDER — CHLORHEXIDINE GLUCONATE 4 % EX LIQD: 60.0000 mL | Freq: Once | CUTANEOUS | Status: DC

## 2016-07-14 MED ORDER — CEFAZOLIN SODIUM-DEXTROSE 2-4 GM/100ML-% IV SOLN
2.0000 g | INTRAVENOUS | Status: AC
  Administered 2016-07-14: 2 g via INTRAVENOUS
  Filled 2016-07-14: qty 100

## 2016-07-14 MED ORDER — METOCLOPRAMIDE HCL 5 MG PO TABS: 5.0000 mg | ORAL_TABLET | Freq: Three times a day (TID) | ORAL | Status: DC | PRN

## 2016-07-14 MED ORDER — CLONAZEPAM 1 MG PO TABS
1.0000 mg | ORAL_TABLET | Freq: Every day | ORAL | Status: DC
  Administered 2016-07-14 – 2016-07-15 (×2): 1 mg via ORAL
  Filled 2016-07-14 (×2): qty 1

## 2016-07-14 MED ORDER — DOCUSATE SODIUM 100 MG PO CAPS: 100.0000 mg | ORAL_CAPSULE | Freq: Two times a day (BID) | ORAL | 0 refills | Status: AC

## 2016-07-14 MED ORDER — ACETAMINOPHEN 650 MG RE SUPP: 650.0000 mg | Freq: Four times a day (QID) | RECTAL | Status: DC | PRN

## 2016-07-14 MED ORDER — PROPOFOL 10 MG/ML IV BOLUS
INTRAVENOUS | Status: DC | PRN
  Administered 2016-07-14: 170 mg via INTRAVENOUS

## 2016-07-14 MED ORDER — LAMOTRIGINE 100 MG PO TABS
100.0000 mg | ORAL_TABLET | Freq: Every day | ORAL | Status: DC
  Administered 2016-07-15: 100 mg via ORAL
  Filled 2016-07-14: qty 1

## 2016-07-14 MED ORDER — DEXTROSE 5 % IV SOLN
INTRAVENOUS | Status: DC | PRN
  Administered 2016-07-14 (×2): 30 ug/min via INTRAVENOUS
  Administered 2016-07-14: 10:00:00 via INTRAVENOUS

## 2016-07-14 MED ORDER — IRBESARTAN 300 MG PO TABS
300.0000 mg | ORAL_TABLET | Freq: Every day | ORAL | Status: DC
  Administered 2016-07-15: 300 mg via ORAL
  Filled 2016-07-14: qty 1

## 2016-07-14 MED ORDER — MAGNESIUM CITRATE PO SOLN: 1.0000 | Freq: Once | ORAL | Status: DC | PRN

## 2016-07-14 MED ORDER — OXYCODONE-ACETAMINOPHEN 5-325 MG PO TABS: 1.0000 | ORAL_TABLET | ORAL | 0 refills | Status: DC | PRN

## 2016-07-14 MED ORDER — DIPHENHYDRAMINE HCL 12.5 MG/5ML PO ELIX: 12.5000 mg | ORAL_SOLUTION | ORAL | Status: DC | PRN

## 2016-07-14 MED ORDER — FAMOTIDINE 20 MG PO TABS
20.0000 mg | ORAL_TABLET | Freq: Two times a day (BID) | ORAL | Status: DC
  Administered 2016-07-14 – 2016-07-15 (×3): 20 mg via ORAL
  Filled 2016-07-14 (×3): qty 1

## 2016-07-14 MED ORDER — HYDROCHLOROTHIAZIDE 12.5 MG PO CAPS
12.5000 mg | ORAL_CAPSULE | Freq: Every day | ORAL | Status: DC
  Administered 2016-07-15: 12.5 mg via ORAL
  Filled 2016-07-14: qty 1

## 2016-07-14 MED ORDER — LAMOTRIGINE 100 MG PO TABS
200.0000 mg | ORAL_TABLET | Freq: Every day | ORAL | Status: DC
  Administered 2016-07-14: 200 mg via ORAL
  Filled 2016-07-14: qty 2

## 2016-07-14 MED ORDER — ACETAMINOPHEN 325 MG PO TABS: 650.0000 mg | ORAL_TABLET | Freq: Four times a day (QID) | ORAL | Status: DC | PRN

## 2016-07-14 MED ORDER — ACETAMINOPHEN 325 MG PO TABS
650.0000 mg | ORAL_TABLET | Freq: Four times a day (QID) | ORAL | Status: AC
  Administered 2016-07-14 – 2016-07-15 (×4): 650 mg via ORAL
  Filled 2016-07-14 (×4): qty 2

## 2016-07-14 MED ORDER — ONDANSETRON HCL 4 MG PO TABS: 4.0000 mg | ORAL_TABLET | Freq: Three times a day (TID) | ORAL | 0 refills | Status: DC | PRN

## 2016-07-14 MED ORDER — METHOCARBAMOL 1000 MG/10ML IJ SOLN
500.0000 mg | Freq: Four times a day (QID) | INTRAMUSCULAR | Status: DC | PRN
  Filled 2016-07-14: qty 5

## 2016-07-14 MED ORDER — LAMOTRIGINE 100 MG PO TABS: 100.0000 mg | ORAL_TABLET | ORAL | Status: DC

## 2016-07-14 MED ORDER — BUPIVACAINE-EPINEPHRINE (PF) 0.5% -1:200000 IJ SOLN
INTRAMUSCULAR | Status: DC | PRN
  Administered 2016-07-14: 30 mL via PERINEURAL

## 2016-07-14 MED ORDER — ONDANSETRON HCL 4 MG PO TABS: 4.0000 mg | ORAL_TABLET | Freq: Four times a day (QID) | ORAL | Status: DC | PRN

## 2016-07-14 MED ORDER — METHOCARBAMOL 500 MG PO TABS
500.0000 mg | ORAL_TABLET | Freq: Four times a day (QID) | ORAL | Status: DC | PRN
  Administered 2016-07-14 – 2016-07-15 (×4): 500 mg via ORAL
  Filled 2016-07-14 (×4): qty 1

## 2016-07-14 MED ORDER — GABAPENTIN 300 MG PO CAPS
600.0000 mg | ORAL_CAPSULE | Freq: Three times a day (TID) | ORAL | Status: DC
  Administered 2016-07-14 – 2016-07-15 (×3): 600 mg via ORAL
  Filled 2016-07-14 (×3): qty 2

## 2016-07-14 MED ORDER — METOPROLOL TARTRATE 50 MG PO TABS
50.0000 mg | ORAL_TABLET | Freq: Two times a day (BID) | ORAL | Status: DC
  Administered 2016-07-14 – 2016-07-15 (×2): 50 mg via ORAL
  Filled 2016-07-14 (×2): qty 1

## 2016-07-14 MED ORDER — STERILE WATER FOR IRRIGATION IR SOLN
Status: DC | PRN
  Administered 2016-07-14: 1000 mL

## 2016-07-14 MED ORDER — DOCUSATE SODIUM 100 MG PO CAPS
100.0000 mg | ORAL_CAPSULE | Freq: Two times a day (BID) | ORAL | Status: DC
  Administered 2016-07-14 – 2016-07-15 (×3): 100 mg via ORAL
  Filled 2016-07-14 (×3): qty 1

## 2016-07-14 MED ORDER — MIDAZOLAM HCL 2 MG/2ML IJ SOLN
INTRAMUSCULAR | Status: AC
  Filled 2016-07-14: qty 2

## 2016-07-14 MED ORDER — SORBITOL 70 % SOLN: 30.0000 mL | Freq: Every day | Status: DC | PRN

## 2016-07-14 MED ORDER — PROPOFOL 10 MG/ML IV BOLUS
INTRAVENOUS | Status: AC
  Filled 2016-07-14: qty 20

## 2016-07-14 MED ORDER — VENLAFAXINE HCL ER 150 MG PO CP24
150.0000 mg | ORAL_CAPSULE | Freq: Every day | ORAL | Status: DC
  Administered 2016-07-14: 150 mg via ORAL
  Filled 2016-07-14: qty 1

## 2016-07-14 MED ORDER — DEXAMETHASONE SODIUM PHOSPHATE 10 MG/ML IJ SOLN
INTRAMUSCULAR | Status: DC | PRN
  Administered 2016-07-14: 10 mg via INTRAVENOUS

## 2016-07-14 MED ORDER — FENTANYL CITRATE (PF) 100 MCG/2ML IJ SOLN: 25.0000 ug | INTRAMUSCULAR | Status: DC | PRN

## 2016-07-14 MED ORDER — LIDOCAINE HCL (CARDIAC) 20 MG/ML IV SOLN
INTRAVENOUS | Status: DC | PRN
  Administered 2016-07-14: 100 mg via INTRAVENOUS

## 2016-07-14 MED ORDER — ONDANSETRON HCL 4 MG/2ML IJ SOLN: 4.0000 mg | Freq: Four times a day (QID) | INTRAMUSCULAR | Status: DC | PRN

## 2016-07-14 MED ORDER — LIDOCAINE 2% (20 MG/ML) 5 ML SYRINGE
INTRAMUSCULAR | Status: AC
  Filled 2016-07-14: qty 5

## 2016-07-14 MED ORDER — MIDAZOLAM HCL 5 MG/5ML IJ SOLN
INTRAMUSCULAR | Status: DC | PRN
  Administered 2016-07-14: 2 mg via INTRAVENOUS

## 2016-07-14 MED ORDER — SODIUM CHLORIDE 0.9 % IR SOLN
Status: DC | PRN
  Administered 2016-07-14: 1000 mL

## 2016-07-14 MED ORDER — LACTATED RINGERS IV SOLN: INTRAVENOUS | Status: DC | PRN

## 2016-07-14 MED ORDER — ACETAMINOPHEN 500 MG PO TABS
1000.0000 mg | ORAL_TABLET | Freq: Once | ORAL | Status: AC
  Administered 2016-07-14: 1000 mg via ORAL
  Filled 2016-07-14: qty 2

## 2016-07-14 MED ORDER — LACTATED RINGERS IV SOLN
INTRAVENOUS | Status: DC
  Administered 2016-07-14 – 2016-07-15 (×2): via INTRAVENOUS

## 2016-07-14 MED ORDER — ASPIRIN EC 81 MG PO TBEC: 81.0000 mg | DELAYED_RELEASE_TABLET | Freq: Every day | ORAL | 0 refills | Status: AC

## 2016-07-14 MED ORDER — VALSARTAN-HYDROCHLOROTHIAZIDE 320-12.5 MG PO TABS: 1.0000 | ORAL_TABLET | Freq: Every day | ORAL | Status: DC

## 2016-07-14 MED ORDER — NORTRIPTYLINE HCL 25 MG PO CAPS
25.0000 mg | ORAL_CAPSULE | Freq: Every day | ORAL | Status: DC
  Administered 2016-07-14: 25 mg via ORAL
  Filled 2016-07-14: qty 1

## 2016-07-14 MED ORDER — METHOCARBAMOL 500 MG PO TABS: 500.0000 mg | ORAL_TABLET | Freq: Four times a day (QID) | ORAL | 0 refills | Status: AC | PRN

## 2016-07-14 MED ORDER — SUCCINYLCHOLINE CHLORIDE 20 MG/ML IJ SOLN
INTRAMUSCULAR | Status: DC | PRN
  Administered 2016-07-14: 100 mg via INTRAVENOUS

## 2016-07-14 MED ORDER — ZOLPIDEM TARTRATE 5 MG PO TABS
5.0000 mg | ORAL_TABLET | Freq: Every day | ORAL | Status: DC
Start: 2016-07-14 — End: 2016-07-15
  Administered 2016-07-14: 5 mg via ORAL
  Filled 2016-07-14: qty 1

## 2016-07-14 MED ORDER — TRAMADOL HCL 50 MG PO TABS: 50.0000 mg | ORAL_TABLET | Freq: Two times a day (BID) | ORAL | Status: DC | PRN

## 2016-07-14 MED ORDER — SUCCINYLCHOLINE CHLORIDE 200 MG/10ML IV SOSY
PREFILLED_SYRINGE | INTRAVENOUS | Status: AC
  Filled 2016-07-14: qty 10

## 2016-07-14 MED ORDER — FENTANYL CITRATE (PF) 100 MCG/2ML IJ SOLN
INTRAMUSCULAR | Status: DC | PRN
  Administered 2016-07-14: 100 ug via INTRAVENOUS

## 2016-07-14 MED ORDER — METOPROLOL TARTARATE 1 MG/ML SYRINGE (5ML): Status: DC | PRN

## 2016-07-14 MED ORDER — METOCLOPRAMIDE HCL 5 MG/ML IJ SOLN: 5.0000 mg | Freq: Three times a day (TID) | INTRAMUSCULAR | Status: DC | PRN

## 2016-07-14 MED ORDER — POLYETHYLENE GLYCOL 3350 17 G PO PACK
17.0000 g | PACK | Freq: Every day | ORAL | Status: DC | PRN
Start: 2016-07-14 — End: 2016-07-15

## 2016-07-14 MED ORDER — SUMATRIPTAN SUCCINATE 50 MG PO TABS
50.0000 mg | ORAL_TABLET | Freq: Once | ORAL | Status: DC | PRN
  Filled 2016-07-14: qty 1

## 2016-07-14 MED ORDER — FENTANYL CITRATE (PF) 100 MCG/2ML IJ SOLN
INTRAMUSCULAR | Status: AC
  Filled 2016-07-14: qty 2

## 2016-07-14 SURGICAL SUPPLY — 62 items
BIT DRILL TWIST 2.7 (BIT) ×2 IMPLANT
BIT DRILL TWIST 2.7MM (BIT) ×1
BLADE SAW SAG 73X25 THK (BLADE) ×2
BLADE SAW SGTL 73X25 THK (BLADE) ×1 IMPLANT
CAPT SHLDR REVTOTAL 2 ×3 IMPLANT
CHLORAPREP W/TINT 26ML (MISCELLANEOUS) ×3 IMPLANT
CLEANER TIP ELECTROSURG 2X2 (MISCELLANEOUS) ×3 IMPLANT
CLOSURE STERI-STRIP 1/2X4 (GAUZE/BANDAGES/DRESSINGS) ×1
CLOSURE WOUND 1/2 X4 (GAUZE/BANDAGES/DRESSINGS)
CLSR STERI-STRIP ANTIMIC 1/2X4 (GAUZE/BANDAGES/DRESSINGS) ×2 IMPLANT
COVER BACK TABLE 60X90IN (DRAPES) ×3 IMPLANT
COVER SURGICAL LIGHT HANDLE (MISCELLANEOUS) ×3 IMPLANT
DRAPE INCISE IOBAN 66X45 STRL (DRAPES) ×3 IMPLANT
DRAPE U-SHAPE 47X51 STRL (DRAPES) ×3 IMPLANT
DRESSING AQUACEL AG SP 3.5X10 (GAUZE/BANDAGES/DRESSINGS) ×1 IMPLANT
DRILL BIT 5/64 (BIT) IMPLANT
DRSG ADAPTIC 3X8 NADH LF (GAUZE/BANDAGES/DRESSINGS) ×3 IMPLANT
DRSG AQUACEL AG SP 3.5X10 (GAUZE/BANDAGES/DRESSINGS) ×3
DRSG PAD ABDOMINAL 8X10 ST (GAUZE/BANDAGES/DRESSINGS) ×6 IMPLANT
ELECT REM PT RETURN 9FT ADLT (ELECTROSURGICAL) ×3
ELECTRODE REM PT RTRN 9FT ADLT (ELECTROSURGICAL) ×1 IMPLANT
GAUZE SPONGE 4X4 12PLY STRL (GAUZE/BANDAGES/DRESSINGS) ×6 IMPLANT
GLOVE BIO SURGEON STRL SZ7.5 (GLOVE) ×12 IMPLANT
GLOVE BIOGEL PI IND STRL 8 (GLOVE) ×2 IMPLANT
GLOVE BIOGEL PI INDICATOR 8 (GLOVE) ×4
GLOVE SURG SS PI 6.5 STRL IVOR (GLOVE) ×6 IMPLANT
GLOVE SURG SS PI 7.5 STRL IVOR (GLOVE) ×3 IMPLANT
GOWN STRL REUS W/ TWL LRG LVL3 (GOWN DISPOSABLE) ×3 IMPLANT
GOWN STRL REUS W/ TWL XL LVL3 (GOWN DISPOSABLE) IMPLANT
GOWN STRL REUS W/TWL LRG LVL3 (GOWN DISPOSABLE) ×6
GOWN STRL REUS W/TWL XL LVL3 (GOWN DISPOSABLE)
KIT BASIN OR (CUSTOM PROCEDURE TRAY) ×3 IMPLANT
KIT ROOM TURNOVER OR (KITS) ×3 IMPLANT
MANIFOLD NEPTUNE II (INSTRUMENTS) ×3 IMPLANT
NDL SUT .5 MAYO 1.404X.05X (NEEDLE) ×1 IMPLANT
NEEDLE HYPO 25GX1X1/2 BEV (NEEDLE) ×3 IMPLANT
NEEDLE MAYO TAPER (NEEDLE) ×2
NS IRRIG 1000ML POUR BTL (IV SOLUTION) ×3 IMPLANT
PACK SHOULDER (CUSTOM PROCEDURE TRAY) ×3 IMPLANT
PAD ABD 8X10 STRL (GAUZE/BANDAGES/DRESSINGS) ×3 IMPLANT
PAD ARMBOARD 7.5X6 YLW CONV (MISCELLANEOUS) ×3 IMPLANT
PIN THREADED REVERSE (PIN) ×3 IMPLANT
RESTRAINT HEAD UNIVERSAL NS (MISCELLANEOUS) ×3 IMPLANT
SLING ARM IMMOBILIZER LRG (SOFTGOODS) ×6 IMPLANT
SLING ARM IMMOBILIZER MED (SOFTGOODS) IMPLANT
SPONGE GAUZE 4X4 12PLY STER LF (GAUZE/BANDAGES/DRESSINGS) ×3 IMPLANT
SPONGE LAP 18X18 X RAY DECT (DISPOSABLE) ×3 IMPLANT
STRIP CLOSURE SKIN 1/2X4 (GAUZE/BANDAGES/DRESSINGS) IMPLANT
SUCTION FRAZIER HANDLE 10FR (MISCELLANEOUS) ×2
SUCTION TUBE FRAZIER 10FR DISP (MISCELLANEOUS) ×1 IMPLANT
SUPPORT WRAP ARM LG (MISCELLANEOUS) IMPLANT
SUT FIBERWIRE #2 38 T-5 BLUE (SUTURE) ×12
SUT MNCRL AB 4-0 PS2 18 (SUTURE) ×3 IMPLANT
SUT MON AB 2-0 CT1 36 (SUTURE) ×3 IMPLANT
SUT VIC AB 0 CT1 27 (SUTURE) ×2
SUT VIC AB 0 CT1 27XBRD ANBCTR (SUTURE) ×1 IMPLANT
SUTURE FIBERWR #2 38 T-5 BLUE (SUTURE) ×4 IMPLANT
TOWEL OR 17X24 6PK STRL BLUE (TOWEL DISPOSABLE) IMPLANT
TOWEL OR 17X26 10 PK STRL BLUE (TOWEL DISPOSABLE) ×3 IMPLANT
TOWER CARTRIDGE SMART MIX (DISPOSABLE) IMPLANT
TRAY FOLEY CATH SILVER 14FR (SET/KITS/TRAYS/PACK) IMPLANT
WATER STERILE IRR 1000ML POUR (IV SOLUTION) IMPLANT

## 2016-07-14 NOTE — Op Note (Signed)
07/14/2016  10:20 AM  PATIENT:  Mallory Mills    PRE-OPERATIVE DIAGNOSIS:  OA RIGHT SHOULDER  POST-OPERATIVE DIAGNOSIS:  Same  PROCEDURE:  TOTAL REVERSE SHOULDER ARTHROPLASTY  SURGEON:  MURPHY, Jewel Baize, MD  PHYSICIAN ASSISTANT: Aquilla Hacker, PA-C, he was present and scrubbed throughout the case, critical for completion in a timely fashion, and for retraction, instrumentation, and closure.   ANESTHESIA:   General  PREOPERATIVE INDICATIONS:  Mallory Mills is a  61 y.o. female with a diagnosis of OA RIGHT SHOULDER who failed conservative measures and elected for surgical management.    The risks benefits and alternatives were discussed with the patient preoperatively including but not limited to the risks of infection, bleeding, nerve injury, cardiopulmonary complications, the need for revision surgery, dislocation, brachial plexus palsy, incomplete relief of pain, among others, and the patient was willing to proceed.  OPERATIVE IMPLANTS: Biomet size 10 humeral stem press-fit standard with a std 44 mm reverse shoulder arthroplasty tray with a  glenosphere with a mini baseplate and 4 locking screws and one central nonlocking screw.  OPERATIVE PROCEDURE: The patient was brought to the operating room and placed in the supine position. General anesthesia was administered. IV antibiotics were given. A Foley was placed. Time out was performed. The upper extremity was prepped and draped in usual sterile fashion. The patient was in a beachchair position. Deltopectoral approach was carried out. The biceps was tenodesed to the pectoralis tendon with #2 Fiberwire. The subscapularis was released off of the bone.   I then performed circumferential releases of the humerus, and then dislocated the head, and then reamed with the reamer to the above named size.  I then applied the jig, and cut the humeral head in 30 of retroversion, and then turned my attention to the glenoid.  Deep retractors were  placed, and I resected the labrum, and then placed a guidepin into the center position on the glenoid, with slight inferior inclination. I then reamed over the guidepin, and this created a small metaphyseal cancellus blush inferiorly, removing just the cartilage to the subchondral bone superiorly. The base plate was selected and impacted place, and then I secured it centrally with a nonlocking screw, and I had excellent purchase both inferiorly and superiorly. I placed a short locking screws on anterior and posterior aspects.  I then turned my attention to the glenosphere, and impacted this into place, placing slight inferior offset (set on B).   The glenoid sphere was completely seated, and had engagement of the [facility] taper. I then turned my attention back to the humerus.  I sequentially broached, and then trialed, and was found to restore soft tissue tension, and it had 2 finger tightness. Therefore the above named components were selected. The shoulder felt stable throughout functional motion.  I then impacted the real prosthesis into place, as well as the real humeral tray, and reduced the shoulder. The shoulder had excellent motion, and was stable, and I irrigated the wounds copiously.   Before I placed the real prosthesis I had also placed a total of 3 #2 FiberWire through drill holes in the humerus. I then used these to repair the subscapularis. This came down to bone.  I then irrigated the shoulder copiously once more, repaired the deltopectoral interval with Vicryl followed by subcutaneous Vicryl with Steri-Strips and sterile gauze for the skin. The patient was awakened and returned back in stable and satisfactory condition. There no complications and She tolerated the procedure well.  POST  OP PLAN: Sling for 2wks, Mobilize for DVT px

## 2016-07-14 NOTE — Transfer of Care (Signed)
Immediate Anesthesia Transfer of Care Note  Patient: Mallory SpenceMary Ann Mills  Procedure(s) Performed: Procedure(s): TOTAL REVERSE SHOULDER ARTHROPLASTY (Right)  Patient Location: PACU  Anesthesia Type:General  Level of Consciousness: awake and patient cooperative  Airway & Oxygen Therapy: Patient Spontanous Breathing  Post-op Assessment: Report given to RN and Post -op Vital signs reviewed and stable  Post vital signs: Reviewed and stable  Last Vitals:  Vitals:   07/14/16 0551 07/14/16 1034  BP: (!) 156/80   Pulse: (!) 101   Resp: 20   Temp: 36.6 C (P) 36.9 C    Last Pain:  Vitals:   07/14/16 0551  TempSrc: Oral         Complications: No apparent anesthesia complications

## 2016-07-14 NOTE — Anesthesia Postprocedure Evaluation (Signed)
Anesthesia Post Note  Patient: Mallory Mills  Procedure(s) Performed: Procedure(s) (LRB): TOTAL REVERSE SHOULDER ARTHROPLASTY (Right)  Patient location during evaluation: PACU Anesthesia Type: General Level of consciousness: sedated Pain management: satisfactory to patient Vital Signs Assessment: post-procedure vital signs reviewed and stable Respiratory status: spontaneous breathing Cardiovascular status: stable Anesthetic complications: no       Last Vitals:  Vitals:   07/14/16 1233 07/14/16 1259  BP:  135/73  Pulse: 89 90  Resp: 14 17  Temp: 36.1 C 36.7 C    Last Pain:  Vitals:   07/14/16 1259  TempSrc: Oral  PainSc:                  Jiles GarterJACKSON,Mallory [place] EDWARD

## 2016-07-14 NOTE — Anesthesia Procedure Notes (Signed)
Anesthesia Regional Block: Interscalene brachial plexus block   Pre-Anesthetic Checklist: ,, timeout performed, Correct Patient, Correct Site, Correct Laterality, Correct Procedure, Correct Position, site marked, Risks and benefits discussed, pre-op evaluation,  At surgeon's request and post-op pain management  Laterality: Right  Prep: chloraprep       Needles:   Needle Type: Echogenic Needle     Needle Length: 9cm  Needle Gauge: 21     Additional Needles:   Narrative:  Start time: 07/14/2016 7:05 AM End time: 07/14/2016 7:14 AM Injection made incrementally with aspirations every 5 mL. Anesthesiologist: Cristela BlueJACKSON, Caelen Higinbotham  Additional Notes: 30cc .5% Marcaine

## 2016-07-14 NOTE — Anesthesia Procedure Notes (Signed)
Procedure Name: Intubation Date/Time: 07/14/2016 7:46 AM Performed by: Rosiland OzMEYERS, Quillan Whitter Pre-anesthesia Checklist: Patient identified, Emergency Drugs available, Suction available, Patient being monitored and Timeout performed Patient Re-evaluated:Patient Re-evaluated prior to inductionOxygen Delivery Method: Circle system utilized Preoxygenation: Pre-oxygenation with 100% oxygen Intubation Type: IV induction Ventilation: Mask ventilation without difficulty Laryngoscope Size: Miller and 3 Grade View: Grade I Tube type: Oral Tube size: 7.0 mm Number of attempts: 1 Airway Equipment and Method: Stylet Placement Confirmation: ETT inserted through vocal cords under direct vision,  positive ETCO2 and breath sounds checked- equal and bilateral Secured at: 21 cm Tube secured with: Tape Dental Injury: Teeth and Oropharynx as per pre-operative assessment

## 2016-07-14 NOTE — Interval H&P Note (Signed)
History and Physical Interval Note:  07/14/2016 7:37 AM  Mallory Mills  has presented today for surgery, with the diagnosis of OA RIGHT SHOULDER  The various methods of treatment have been discussed with the patient and family. After consideration of risks, benefits and other options for treatment, the patient has consented to  Procedure(s): TOTAL SHOULDER ARTHROPLASTY (Right) as a surgical intervention .  The patient's history has been reviewed, patient examined, no change in status, stable for surgery.  I have reviewed the patient's chart and labs.  Questions were answered to the patient's satisfaction.     Evander Macaraeg D

## 2016-07-14 NOTE — Discharge Instructions (Signed)
Diet: As you were doing prior to hospitalization   Shower:  May shower but keep the wounds dry.  Dressing is waterproof.  Activity:  Increase activity slowly as tolerated, but follow the weight bearing instructions below.  The rules on driving is that you can not be taking narcotics while you drive, and you must feel in control of the vehicle.    Weight Bearing:  Non weight bearing right arm.  Maintain Sling   To prevent constipation: you may use a stool softener such as -  Colace (over the counter) 100 mg by mouth twice a day  Drink plenty of fluids (prune juice may be helpful) and high fiber foods Miralax (over the counter) for constipation as needed.    Itching:  If you experience itching with your medications, try taking only a single pain pill, or even half a pain pill at a time.  You may take up to 10 pain pills per day, and you can also use benadryl over the counter for itching or also to help with sleep.   Precautions:  If you experience chest pain or shortness of breath - call 911 immediately for transfer to the hospital emergency department!!  If you develop a fever greater that 101 F, purulent drainage from wound, increased redness or drainage from wound, or calf pain -- Call the office at [phone]                                                Follow- Up Appointment:  Please call for an appointment to be seen in 2 weeks [place] - [phone]

## 2016-07-14 NOTE — Interval H&P Note (Signed)
History and Physical Interval Note:  07/14/2016 7:36 AM  Mallory Mills  has presented today for surgery, with the diagnosis of OA RIGHT SHOULDER  The various methods of treatment have been discussed with the patient and family. After consideration of risks, benefits and other options for treatment, the patient has consented to  Procedure(s): TOTAL SHOULDER ARTHROPLASTY (Right) as a surgical intervention .  The patient's history has been reviewed, patient examined, no change in status, stable for surgery.  I have reviewed the patient's chart and labs.  Questions were answered to the patient's satisfaction.     Kayte Borchard D

## 2016-07-15 ENCOUNTER — Encounter (HOSPITAL_COMMUNITY): Payer: Self-pay | Admitting: Orthopedic Surgery

## 2016-07-15 DIAGNOSIS — M19011 Primary osteoarthritis, right shoulder: Secondary | ICD-10-CM | POA: Diagnosis not present

## 2016-07-15 LAB — HEMOGLOBIN AND HEMATOCRIT, BLOOD
HCT: 37.2 % (ref 36.0–46.0)
HEMOGLOBIN: 11.8 g/dL — AB (ref 12.0–15.0)
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
## 2016-07-15 NOTE — Progress Notes (Addendum)
   Assessment: 1 Day Post-Op  S/P Procedure(s) (LRB): TOTAL REVERSE SHOULDER ARTHROPLASTY (Right) by Dr. Timothy D. Murphy on 07/14/16  Principal Problem:   Localized osteoarthritis of right shoulder Active Problems:   Chronic headaches   Seizures (HCC)   MigraineJewel Mills   Depression   Hypertension   Sleep apnea   Primary osteoarthritis, right shoulder  AFVSN.  Feeling pretty well this morning.  Sore as expected.  Pain controlled.  Urinating and tolerating diet.   OOB to bathroom - a little light-headed -likely dt anesthesia / pain medicines.  Will check H/H this A.M. Okay for discharge later today if H/H okay.  Plan: Mobilize Please place [facility] before D/C Continue to push incentive spirometry.  Continue CPAP at home for OSA. Maintain ice Continue Tylenol, Robaxin, and Oxycodone for pain  Weight Bearing: Non Weight Bearing (NWB) right arm.  Maintain sling Dressings: Maintain aquacel.  VTE prophylaxis: Lovenox while Inpatient. SCDs, ambulation.  ASA 81mg  at home for 30 days Dispo: Home today in care of her friend Kriste BasqueBecky  Subjective: Patient reports pain as moderate. Pain controlled with PO meds.  Tolerating diet.  Urinating.  +Flatus.  No CP, SOB.  OOB.  Objective:   VITALS:   Vitals:   07/14/16 2100 07/14/16 2313 07/15/16 0029 07/15/16 0553  BP: (!) 154/92  138/90 127/72  Pulse: (!) 112 (!) 111 (!) 109 92  Resp:  17 16 16   Temp: 98 F (36.7 C)  97.6 F (36.4 C) 97.5 F (36.4 C)  TempSrc: Oral  Oral   SpO2: 95% 94% 92% 92%  Weight:       CBC Latest Ref Rng & Units 07/08/2016 12/13/2015 01/29/2015  WBC 4.0 - 10.5 K/uL 8.7 7.4 8.2  Hemoglobin 12.0 - 15.0 g/dL 08.613.5 57.812.8 46.912.8  Hematocrit 36.0 - 46.0 % 42.2 39.7 39.4  Platelets 150 - 400 K/uL 272 320 318   BMP Latest Ref Rng & Units 07/08/2016 12/13/2015 01/29/2015  Glucose 65 - 99 mg/dL 629(B168(H) 284(X160(H) 324(M136(H)  BUN 6 - 20 mg/dL 11 10 14   Creatinine 0.44 - 1.00 mg/dL 0.100.99 2.72(Z1.31(H) 3.66(Y1.21(H)  Sodium 135 - 145 mmol/L 139 139 139    Potassium 3.5 - 5.1 mmol/L 3.3(L) 3.9 3.2(L)  Chloride 101 - 111 mmol/L 104 98(L) 101  CO2 22 - 32 mmol/L 23 30 27   Calcium 8.9 - 10.3 mg/dL 9.5 40.310.0 47.410.0   Intake/Output      03/06 0701 - 03/07 0700 03/07 0701 - 03/08 0700   I.V. (mL/kg) 2025 (21.2)    Total Intake(mL/kg) 2025 (21.2)    Blood 200    Total Output 200     Net +1825          Urine Occurrence 2 x     Physical Exam: General: NAD.  Upright in bed.  CPAP in place. Resp: No increased wob Cardio: regular rate and rhythm ABD soft, protuberant Neurologically intact MSK RUE in sling. Sensation improving from block distally.  Moving fingers Hand warm Incision: dressing C/D/I   Albina BilletHenry Calvin Martensen III, PA-C 07/15/2016, 7:02 AM

## 2016-07-15 NOTE — Progress Notes (Signed)
Pt ready for discharge. Education/instructions reviewed with pt and friend, and all questions/cocnerns addressed. IV removed and belongings gathered. Pt will be transported out via wheelchair to friend's vehicle. Will continue to monitor

## 2016-08-13 ENCOUNTER — Telehealth: Payer: Self-pay | Admitting: Neurology

## 2016-08-13 NOTE — Telephone Encounter (Signed)
I called patient to inquire about relief from botox. She did not answer so I left a VM asking for her to call me back.

## 2016-08-13 NOTE — Telephone Encounter (Signed)
Please refer to previous phone note, returned patients call. She did not answer. I left a VM asking for her to return my call. If she calls back and you cannot get a hold of me please ask her if her headaches have decrease by 50 percent since being on botox.

## 2016-08-13 NOTE — Telephone Encounter (Signed)
Pt returned Danielle's call °

## 2016-08-13 NOTE — Telephone Encounter (Signed)
I called and spoke with the patient and she stated that she has had a reduction of 50 percent or more of headaches days since starting the botox.

## 2016-08-13 NOTE — Telephone Encounter (Signed)
Pt returned Mallory Mills's call. Pt said it is hard to tell how many HA's, she's had shoulder surgery (07/14/16) and has been on pain medication. Right after surgery she was on morphine and "heavy "pain medication about 5 days. Since then oxycodone, tramadol, and muscle relaxer when she goes to PT. Patient said she is trying to wean off these medications.  She takes duexis  3 x day and famotidine 26.6mg .

## 2016-08-20 ENCOUNTER — Ambulatory Visit (INDEPENDENT_AMBULATORY_CARE_PROVIDER_SITE_OTHER): Payer: BLUE CROSS/BLUE SHIELD | Admitting: Neurology

## 2016-08-20 VITALS — BP 180/102 | HR 74 | Ht 62.5 in | Wt 214.8 lb

## 2016-08-20 DIAGNOSIS — G43719 Chronic migraine without aura, intractable, without status migrainosus: Secondary | ICD-10-CM | POA: Diagnosis not present

## 2016-08-20 NOTE — Progress Notes (Signed)
Consent Form Botulism Toxin Injection For Chronic Migraine  Botulism toxin has been approved by the Federal drug administration for treatment of chronic migraine. Botulism toxin does not cure chronic migraine and it may not be effective in some patients.  The administration of botulism toxin is accomplished by injecting a small amount of toxin into the muscles of the neck and head. Dosage must be titrated for each individual. Any benefits resulting from botulism toxin tend to wear off after 3 months with a repeat injection required if benefit is to be maintained. Injections are usually done every 3-4 months with maximum effect peak achieved by about 2 or 3 weeks. Botulism toxin is expensive and you should be sure of what costs you will incur resulting from the injection.  The side effects of botulism toxin use for chronic migraine may include:   -Transient, and usually mild, facial weakness with facial injections  -Transient, and usually mild, head or neck weakness with head/neck injections  -Reduction or loss of forehead facial animation due to forehead muscle              weakness  -Eyelid drooping  -Dry eye  -Pain at the site of injection or bruising at the site of injection  -Double vision  -Potential unknown long term risks  Contraindications: You should not have Botox if you are pregnant, nursing, allergic to albumin, have an infection, skin condition, or muscle weakness at the site of the injection, or have myasthenia gravis, Lambert-Eaton syndrome, or ALS.  It is also possible that as with any injection, there may be an allergic reaction or no effect from the medication. Reduced effectiveness after repeated injections is sometimes seen and rarely infection at the injection site may occur. All care will be taken to prevent these side effects. If therapy is given over a long time, atrophy and wasting in the muscle injected may occur. Occasionally the patient's become refractory to  treatment because they develop antibodies to the toxin. In this event, therapy needs to be modified.  I have read the above information and consent to the administration of botulism toxin.    ______________  _____   _________________  Patient signature     Date   Witness signature       BOTOX PROCEDURE NOTE FOR MIGRAINE HEADACHE    Contraindications and precautions discussed with patient(above). Aseptic procedure was observed and patient tolerated procedure. Procedure performed by Butch Penny NP and Dr. Artemio Aly  The condition has existed for more than 6 months, and pt does not have a diagnosis of ALS, Myasthenia Gravis or Lambert-Eaton Syndrome. Risks and benefits of injections discussed and pt agrees to proceed with the procedure. Written consent obtained  These injections are medically necessary. He receives good benefits from these injections. These injections do not cause sedations or hallucinations which the oral therapies may cause.  Indication/Diagnosis: chronic migraine BOTOX(J0585) injection was performed according to protocol by [employer]. 200 units of BOTOX was dissolved into 4 cc NS.  NDC: [address]-40  Type of toxin: Botox  Lot # [id number] EXP: 01/2019   Description of procedure:  The patient was placed in a sitting position. The standard protocol was used for Botox as follows, with 5 units of Botox injected at each site:   -Procerus muscle, midline injection  -Corrugator muscle, bilateral injection  -Frontalis muscle, bilateral injection, with 2 sites each side, medial injection was performed in the upper one third of the frontalis muscle, in the region vertical from  the medial inferior edge of the superior orbital rim. The lateral injection was again in the upper one third of the forehead vertically above the lateral limbus of the cornea, 1.5 cm lateral to the medial injection site.  -Temporalis muscle injection, 4 sites, bilaterally. The first  injection was 3 cm above the tragus of the ear, second injection site was 1.5 cm to 3 cm up from the first injection site in line with the tragus of the ear. The third injection site was 1.5-3 cm forward between the first 2 injection sites. The fourth injection site was 1.5 cm posterior to the second injection site.  -Occipitalis muscle injection, 3 sites, bilaterally. The first injection was done one half way between the occipital protuberance and the tip of the mastoid process behind the ear. The second injection site was done lateral and superior to the first, 1 fingerbreadth from the first injection. The third injection site was 1 fingerbreadth superiorly and medially from the first injection site.  -Cervical paraspinal muscle injection, 2 sites, bilateral knee first injection site was 1 cm from the midline of the cervical spine, 3 cm inferior to the lower border of the occipital protuberance. The second injection site was 1.5 cm superiorly and laterally to the first injection site.  -Trapezius muscle injection was performed at 3 sites, bilaterally. The first injection site was in the upper trapezius muscle halfway between the inflection point of the neck, and the acromion. The second injection site was one half way between the acromion and the first injection site. The third injection was done between the first injection site and the inflection point of the neck.   Will return for repeat injection in 3 months.   A 200 unit sof Botox was used, 155 units were injected, the rest of the Botox was wasted. The patient tolerated the procedure well, there were no complications of the above procedure.   Butch Penny, MSN, NP-C 08/20/2016, 5:03 PM [facility] [address] [place], [place] [address] [phone]

## 2016-08-20 NOTE — Progress Notes (Signed)
Botox 100 units/vial x 2 vials from [facility] [phone] Lot [id number] Exp 09 2020  Diluted in 4 ml of Bacteriostatic 0.9% NaCl NDC 4782-9562-13 Lot [id number] Exp [id number]

## 2016-08-20 NOTE — Patient Instructions (Signed)
Remember to drink plenty of fluid, eat healthy meals and do not skip any meals. Try to eat protein with a every meal and eat a healthy snack such as fruit or nuts in between meals. Try to keep a regular sleep-wake schedule and try to exercise daily, particularly in the form of walking, 20-30 minutes a day, if you can.   email me in 6 weeks  Our phone number is [phone]. We also have an after hours call service for urgent matters and there is a physician on-call for urgent questions. For any emergencies you know to call 911 or go to the nearest emergency room

## 2016-08-21 ENCOUNTER — Telehealth: Payer: Self-pay | Admitting: Neurology

## 2016-08-21 NOTE — Telephone Encounter (Signed)
Pt returned RN's call. Pt said she has taken Maxalt as prescribed. RN was in phone POD she said to keep hydrated and continue taking prescribed medications. Pt understood. Pt was advised she would receive a call on Monday.

## 2016-08-21 NOTE — Telephone Encounter (Signed)
Pt called to inform Dr Lucia Gaskins that since 2am this morning she has has a migraine, she has her medication but just wanted Dr Lucia Gaskins to be made aware. She is not sure if this is from the 3rd Botox injection or not

## 2016-08-21 NOTE — Telephone Encounter (Signed)
Left vm for patient to call back about having headache since 0200am. Pt receive botox from Dr.Ahern on 08/20/2016. Pt does have medications order for headaches .Rn requested pt call back before 1200pm before office close. If not the work in doctor can assist her.

## 2016-08-23 ENCOUNTER — Encounter: Payer: Self-pay | Admitting: Neurology

## 2016-08-25 NOTE — Telephone Encounter (Signed)
I don;t think I ever got this message. Would you call her Mallory Mills and see how she is? Doubtful this is from the botox injections. thanks
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
## 2016-09-20 ENCOUNTER — Other Ambulatory Visit: Payer: Self-pay | Admitting: Neurology

## 2016-10-26 ENCOUNTER — Telehealth: Payer: Self-pay | Admitting: Neurology

## 2016-10-26 NOTE — Telephone Encounter (Signed)
Pt would like a call back with the date in May that Dr Lucia GaskinsAhern stated she should no longer work.  Pt said this has to do with her disability.  Pt wants it known that her head aches are coming pretty much every day.

## 2016-10-27 NOTE — Telephone Encounter (Signed)
I saw her on 09/17/2015 but I did not tell her to stop working and have not endorsed disability for migraines.

## 2016-10-27 NOTE — Telephone Encounter (Signed)
Pt is seen every 3 months for Botox due to migraines.

## 2016-11-19 ENCOUNTER — Ambulatory Visit (INDEPENDENT_AMBULATORY_CARE_PROVIDER_SITE_OTHER): Payer: BLUE CROSS/BLUE SHIELD | Admitting: Neurology

## 2016-11-19 ENCOUNTER — Encounter: Payer: Self-pay | Admitting: Neurology

## 2016-11-19 VITALS — BP 121/73 | HR 97 | Wt 212.0 lb

## 2016-11-19 DIAGNOSIS — G43719 Chronic migraine without aura, intractable, without status migrainosus: Secondary | ICD-10-CM | POA: Diagnosis not present

## 2016-11-19 NOTE — Progress Notes (Signed)
Botox 100 units/vial x 2 vials from [facility] [id number] Lot [id number] Exp 10 2020  Diluted in 4 ml of Bacteriostatic 0.9% NaCl NDC [id number] Lot [id number] Exp [id number]

## 2016-11-20 NOTE — Anesthesia Postprocedure Evaluation (Signed)
Anesthesia Post Note  Patient: Mallory Mills  Procedure(s) Performed: Procedure(s) (LRB): TOTAL REVERSE SHOULDER ARTHROPLASTY (Right)     Anesthesia Post Evaluation  Last Vitals:  Vitals:   07/15/16 0029 07/15/16 0553  BP: 138/90 127/72  Pulse: (!) 109 92  Resp: 16 16  Temp: 36.4 C 36.4 C    Last Pain:  Vitals:   07/15/16 1352  TempSrc:   PainSc: 8                  Youssef Footman EDWARD

## 2016-11-20 NOTE — Addendum Note (Signed)
Addendum  created 11/20/16 1518 by Sheralyn Pinegar, MD   Sign clinical note    

## 2016-11-21 NOTE — Progress Notes (Signed)

## 2016-11-23 ENCOUNTER — Telehealth: Payer: Self-pay | Admitting: Neurology

## 2016-11-23 NOTE — Telephone Encounter (Signed)
Call about paperwork

## 2016-11-23 NOTE — Telephone Encounter (Signed)
Patient is calling. She had Botox last Thursday and says she has not had a headache. Also she would like to discuss paperwork she left last Thursday. She wants to apologize if she was short with Dr. Lucia GaskinsAhern.

## 2016-12-02 NOTE — Telephone Encounter (Signed)
Notified pt that Dr. Lucia GaskinsAhern does not complete disability paperwork for migraines, only intermittent leave, if needed.

## 2016-12-02 NOTE — Telephone Encounter (Signed)
Launa GrillJen Hogan discussed with patient last week, we cannot fill out disability paperwork for her.

## 2016-12-18 ENCOUNTER — Other Ambulatory Visit: Payer: Self-pay | Admitting: Sports Medicine

## 2016-12-18 DIAGNOSIS — M545 Low back pain: Secondary | ICD-10-CM

## 2016-12-23 ENCOUNTER — Ambulatory Visit
Payer: BLUE CROSS/BLUE SHIELD | Attending: Internal Medicine | Admitting: Rehabilitative and Restorative Service Providers"

## 2016-12-23 ENCOUNTER — Telehealth: Payer: Self-pay | Admitting: Neurology

## 2016-12-23 DIAGNOSIS — R2689 Other abnormalities of gait and mobility: Secondary | ICD-10-CM | POA: Diagnosis present

## 2016-12-23 DIAGNOSIS — R42 Dizziness and giddiness: Secondary | ICD-10-CM | POA: Diagnosis not present

## 2016-12-23 NOTE — Telephone Encounter (Signed)
Please ask patient to see her ophthalmologist or eye doctor immediately.
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
## 2016-12-23 NOTE — Patient Instructions (Signed)
Habituation - Tip Card  1.The goal of habituation training is to assist in decreasing symptoms of vertigo, dizziness, or nausea provoked by specific head and body motions. 2.These exercises may initially increase symptoms; however, be persistent and work through symptoms. With repetition and time, the exercises will assist in reducing or eliminating symptoms. 3.Exercises should be stopped and discussed with the therapist if you experience any of the following: - Sudden change or fluctuation in hearing - New onset of ringing in the ears, or increase in current intensity - Any fluid discharge from the ear - Severe pain in neck or back - Extreme nausea  Copyright  VHI. All rights reserved.    Habituation - Sit to Side-Lying   Sit on edge of bed. Lie down onto the right side and hold until dizziness stops, plus 20 seconds.  Return to sitting and wait until dizziness stops, plus 20 seconds.  Repeat to the left side. Repeat sequence 2 times per session. Do 2 sessions per day. CAN INCREASE TO 5 repetitions slowly.  So, if you are tolerating 2 repetitions, increase to 3 for a few day, then 4 for a few days to work up to 5 reps.  Copyright  VHI. All rights reserved.      . Head Motion: Side to Side    Sitting, tilt head down slightly, slowly move head to right and then left with eyes open.  Repeat _5___ times per session. Do _2___ sessions per day.  Copyright  VHI. All rights reserved. Head Motion: Up and Down    Sitting, slowly move head up and down with eyes open.  Repeat _5___ times per session. Do __2__ sessions per day.  Copyright  VHI. All rights reserved.  Walking Program:  Begin walking for exercise for , 2times/day, 5days/week.  Progress your walkingprogramby adding 1-62minutes to your routine each week, as tolerated. Be sure to wear good walking shoes, walk in a safe environment and only progress to your tolerance.  Gaze Stabilization: Tip  Card  1.Target must remain in focus, not blurry, and appear stationary while head is in motion. 2.Perform exercises with small head movements (45 to either side of midline). 3.Increase speed of head motion so long as target is in focus. 4.If you wear eyeglasses, be sure you can see target through lens (therapist will give specific instructions for bifocal / progressive lenses). 5.These exercises may provoke dizziness or nausea. Work through these symptoms. If too dizzy, slow head movement slightly. Rest between each exercise. 6.Exercises demand concentration; avoid distractions. 7.For safety, perform standing exercises close to a counter, wall, corner, or next to someone.  Copyright  VHI. All rights reserved.  Gaze Stabilization: Sitting    Keeping eyes on target (held in hand at arms length away), tilt head down slightly and move head side to side for 10 times..  *will want to increase your repetitions from 10 times up to 30 times.--do this gradually over time. Do _2_ sessions per day.  Hold off on this exercise if it provokes headache.   Special Instructions: Exercises may bring on mild to moderate symptoms of dizziness, headaches, nausea that resolve within 30 minutes of completing exercises. If symptoms are lasting longer than 30 minutes, modify your exercises by: >decreasing the # of times you complete each activity >ensuring your symptoms return to baseline before moving onto the next exercise >dividing up exercises so you do not do them all in one session, but multiple short sessions throughout the day >doing them once  a day until symptoms improve

## 2016-12-24 NOTE — Telephone Encounter (Addendum)
Spoke with patient and advised her, per Dr Frances FurbishAthar to call her eye doctor immediately. Patient stated she called yesterday and was seen. Her eye dr told her that her eyes are fine, no issues, problems found.  She does have macular degeneration which was known. The eye dr suggested it may be a "different type of migraine".  She stated that today she is better with only some "lightening flashes".  This RN advised her to call with any problems, concerns, questions prior to her follow up with Dr Lucia GaskinsAhern in Oct. She verbalized understanding, appreciation.

## 2016-12-28 ENCOUNTER — Ambulatory Visit
Admission: RE | Admit: 2016-12-28 | Discharge: 2016-12-28 | Disposition: A | Discharge status: Home / Self Care | Payer: BLUE CROSS/BLUE SHIELD | Source: Ambulatory Visit | Attending: Sports Medicine | Admitting: Sports Medicine

## 2016-12-28 DIAGNOSIS — M545 Low back pain: Secondary | ICD-10-CM

## 2017-02-10 ENCOUNTER — Encounter: Payer: Self-pay | Admitting: Rehabilitative and Restorative Service Providers"

## 2017-02-10 NOTE — Therapy (Signed)
[facility] [address] [place], [place], [address] Phone: [phone]   Fax:  [phone]  Patient Details  Name: Mallory Mills MRN: [id number] Date of Birth: [birth year]  Encounter Date: last encounter 12/23/2016  PHYSICAL THERAPY DISCHARGE SUMMARY  Visits from Start of Care: evaluation only  Current functional level related to goals / functional outcomes: No f/u occurred after initial eval--see notes for patient status.   Remaining deficits: See initial summary   Education / Equipment: Reviewed HEP  Plan: Patient agrees to discharge.  Patient goals were not met. Patient is being discharged due to not returning since the last visit.  ?????        Thank you for the referral of this patient. Rudell Cobb, MPT   [place] [facility] 02/10/2017, 11:03 AM  [facility] [address] [place], [place], [address] Phone: [phone]   Fax:  [phone]

## 2017-02-25 ENCOUNTER — Ambulatory Visit (INDEPENDENT_AMBULATORY_CARE_PROVIDER_SITE_OTHER): Payer: BLUE CROSS/BLUE SHIELD | Admitting: Neurology

## 2017-02-25 ENCOUNTER — Encounter (INDEPENDENT_AMBULATORY_CARE_PROVIDER_SITE_OTHER): Payer: Self-pay

## 2017-02-25 ENCOUNTER — Encounter: Payer: Self-pay | Admitting: Neurology

## 2017-02-25 VITALS — BP 160/81 | HR 80 | Wt 218.6 lb

## 2017-02-25 DIAGNOSIS — G43711 Chronic migraine without aura, intractable, with status migrainosus: Secondary | ICD-10-CM | POA: Diagnosis not present

## 2017-02-25 MED ORDER — SCOPOLAMINE 1 MG/3DAYS TD PT72: 1.0000 | MEDICATED_PATCH | TRANSDERMAL | 11 refills | Status: DC

## 2017-02-25 MED ORDER — SCOPOLAMINE 1 MG/3DAYS TD PT72: 1.0000 | MEDICATED_PATCH | TRANSDERMAL | 11 refills | Status: AC

## 2017-02-25 NOTE — Progress Notes (Signed)
Botox 200 units [facility]  Vial #1: LOT: [id number], EXP: 08/2019, NDC: 4540-9811-91: 0023-1145-01  Vial #2: [id number], EXP: 08/2019, NDC: 0865-7846-96: 0023-1145-01

## 2018-06-25 IMAGING — MR MR LUMBAR SPINE W/O CM
4 of 5 series · 26 of 48 positions shown · non-contrast
Comparison: Lumbar spine radiographs [REDACTED] for a pro

CLINICAL DATA: Low back pain for 2 years, improved with steroid
injection.

EXAM:
MRI LUMBAR SPINE WITHOUT CONTRAST
TECHNIQUE: Multiplanar, multisequence MR imaging of the lumbar spine was
performed. No intravenous contrast was administered.

[Series 3: T2 post-contrast · sagittal · 4.0mm · 0.55mm/px · 5 of 13 slices shown]
[im 1/13]
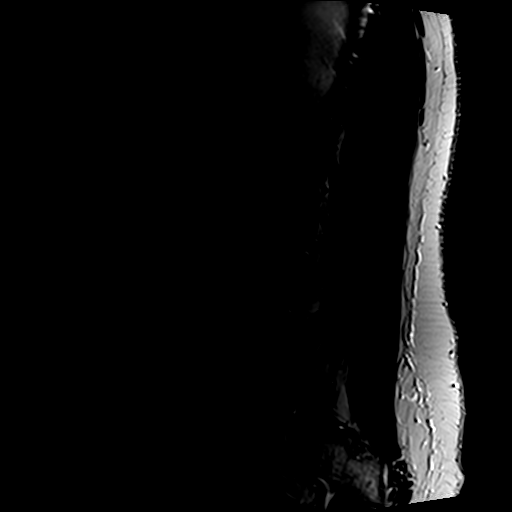
[im 4/13]
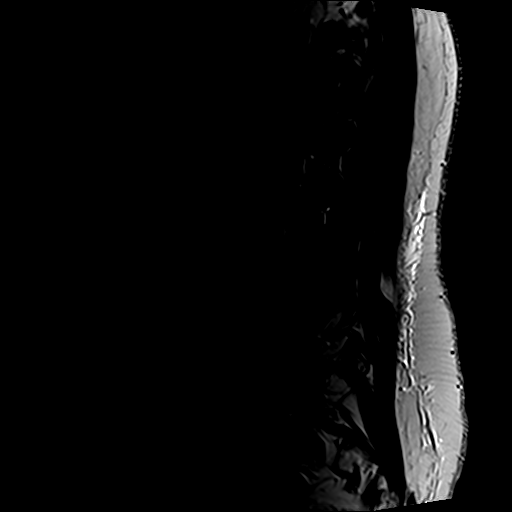
[im 7/13]
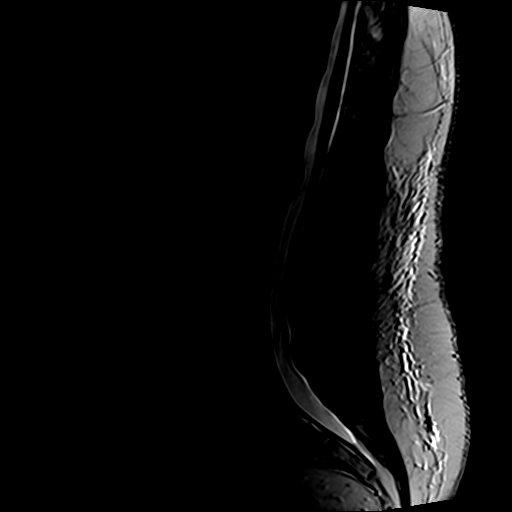
[im 10/13]
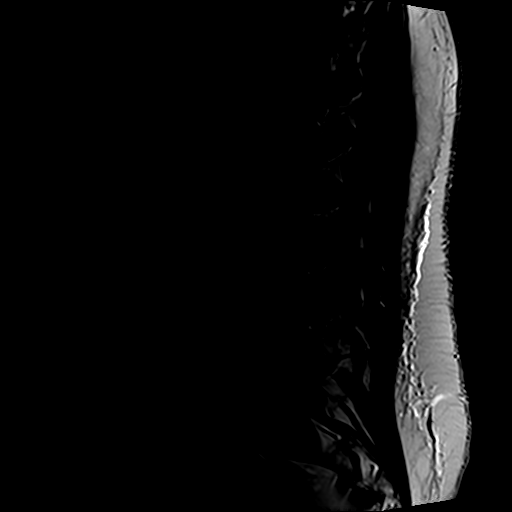
[im 13/13]
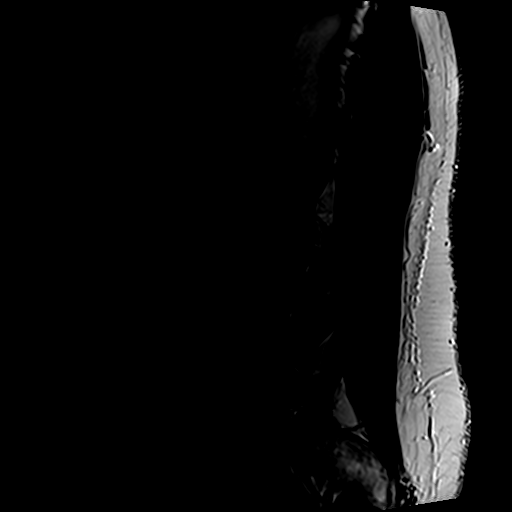

[Series 5: T1 · sagittal · 4.0mm · 0.55mm/px · 6 of 13 slices shown (1 of 2)]
[im 1/13]
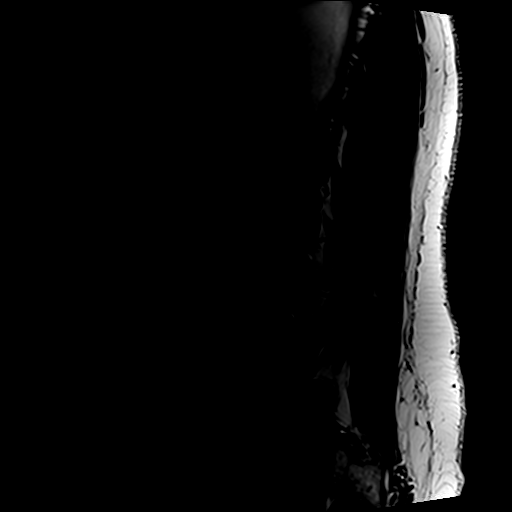
[im 3/13]
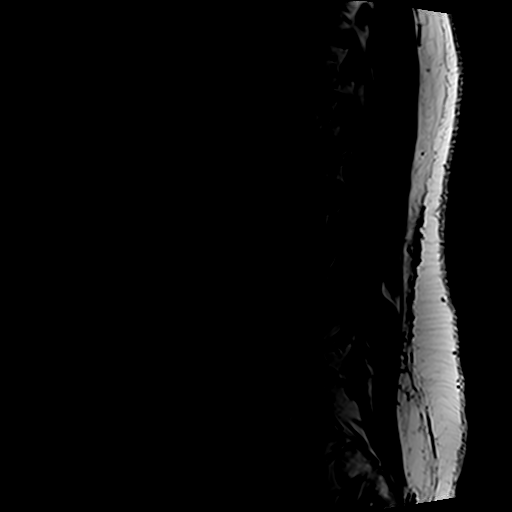
[im 5/13]
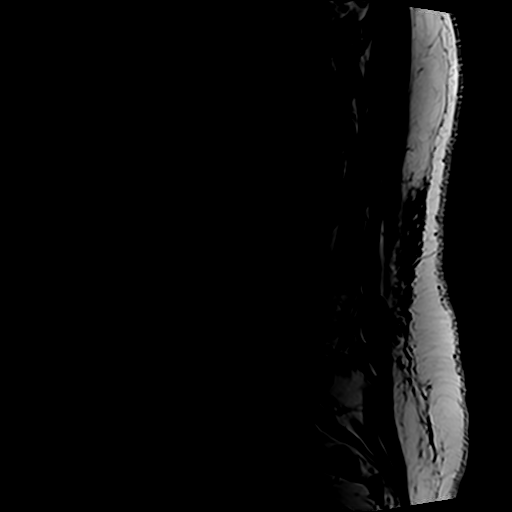
[im 8/13]
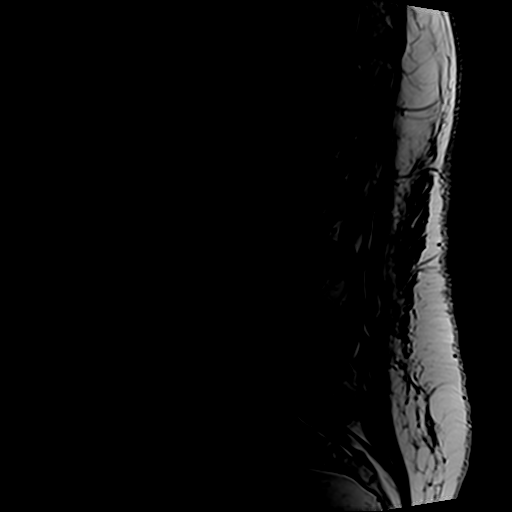
[im 10/13]
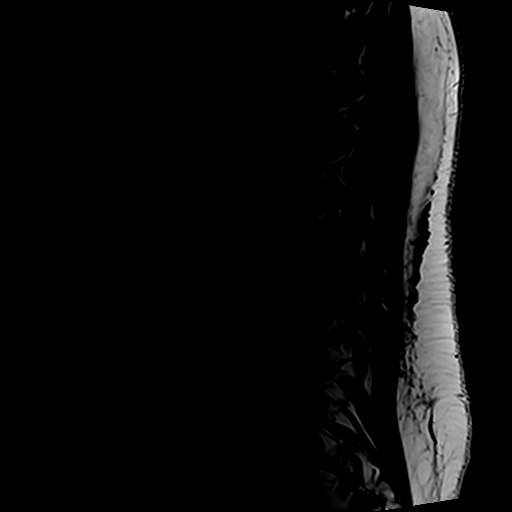
[im 13/13]
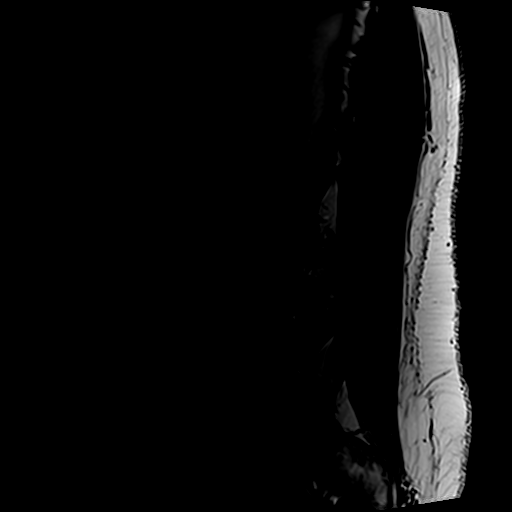

[Series 6: T1 · axial · 4.0mm · 0.35mm/px · z∈[-34,+142]mm · 5 of 37 slices shown (2 of 2)]
[im 3/37]
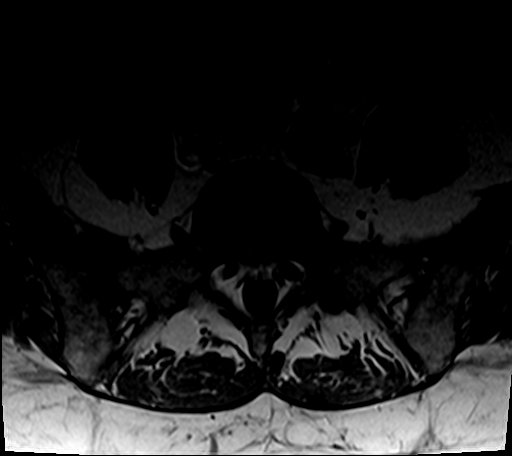
[im 5/37]
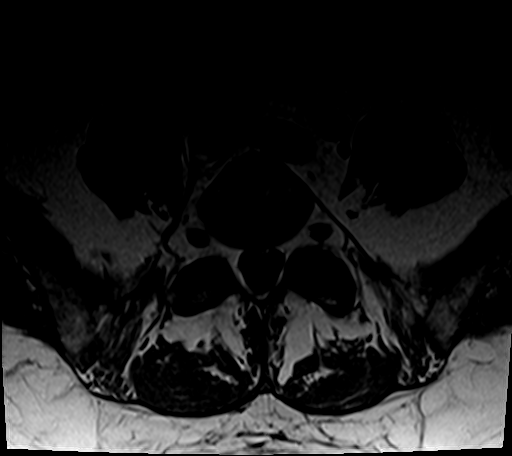
[im 8/37]
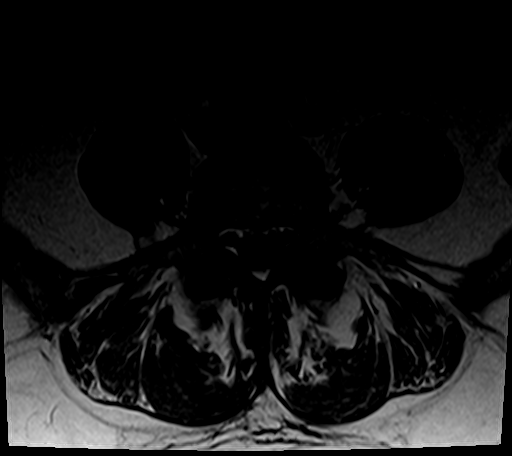
[im 20/37]
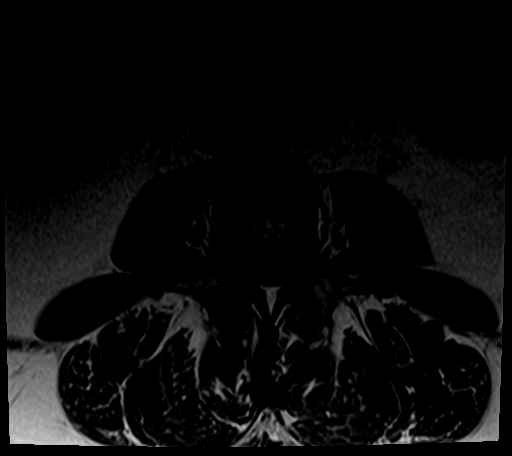
[im 32/37]
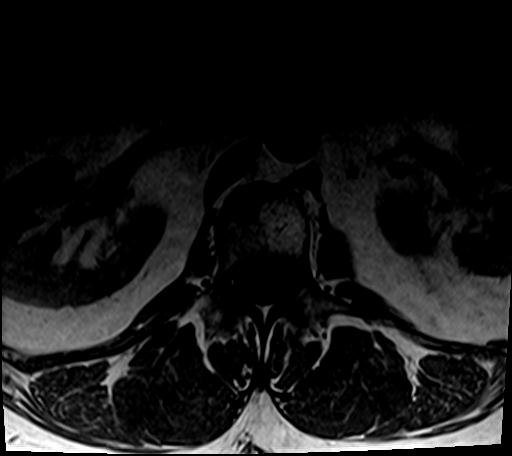

[Series 7: T2 · axial · 4.0mm · 0.70mm/px · z∈[-34,+167]mm · 10 of 37 slices shown]
[im 3/37]
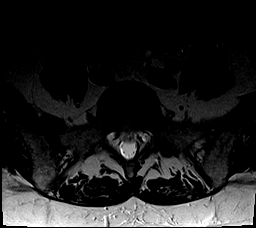
[im 5/37]
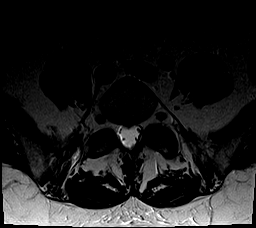
[im 8/37]
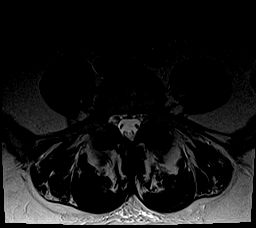
[im 13/37]
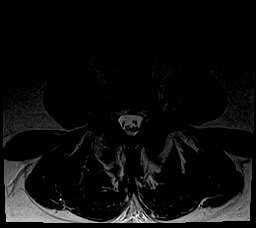
[im 17/37]
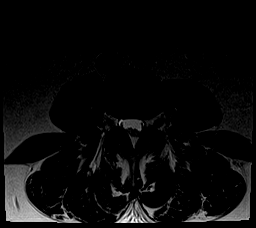
[im 20/37]
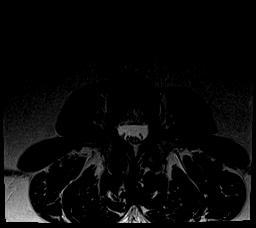
[im 22/37]
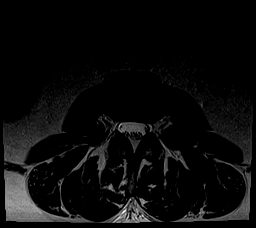
[im 27/37]
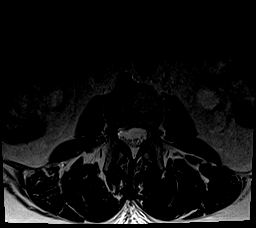
[im 32/37]
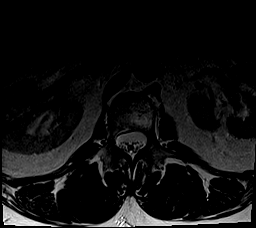
[im 37/37]
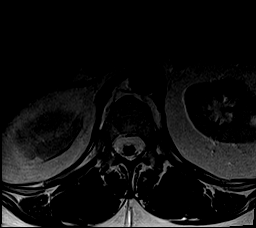

[26 of 48 positions shown; findings below may reference images not displayed]

FINDINGS: SEGMENTATION: For the purposes of this report, the last well-formed
intervertebral disc will be reported as L5-S1.

ALIGNMENT: Maintained lumbar lordosis. No malalignment.

VERTEBRAE:Vertebral bodies are intact. Intervertebral discs
demonstrate normal morphology and signal characteristics. Multilevel
mild chronic discogenic endplate changes without acute or abnormal
bone marrow signal. L1 hemangioma.

CONUS MEDULLARIS: Conus medullaris terminates at L1-2 and
demonstrates normal morphology and signal characteristics. Cauda
equina is normal.

PARASPINAL AND SOFT TISSUES: Included prevertebral and paraspinal
soft tissues are normal.

DISC LEVELS:

T12-L1: Tiny RIGHT central disc protrusion without canal stenosis or
neural foraminal narrowing.

L1-2: 4 x 5 mm RIGHT central T2 bright disc extrusion with 13 mm
contiguous superior and inferior migration in proximity to the
traversing RIGHT L2 nerve. No canal stenosis or neural foraminal
narrowing.

L2-3:  No disc bulge, canal stenosis nor neural foraminal narrowing.

L3-4: No disc bulge, canal stenosis nor neural foraminal narrowing.
Moderate facet arthropathy and ligamentum flavum redundancy.

L4-5: New 4 mm broad-based disc bulge. Moderate to severe facet
arthropathy and ligamentum flavum redundancy without canal stenosis.
No neural foraminal narrowing.

L5-S1: No disc bulge, canal stenosis nor neural foraminal narrowing.
Mild RIGHT, moderate to severe LEFT facet arthropathy.
IMPRESSION: 1. New small L1-2 disc extrusion may be acute, and may affect the
RIGHT L2 nerve. New small L4-5 disc bulge.
2. Degenerative change of the lumbar spine, progressed facet
arthropathy.
3. No canal stenosis or neural foraminal narrowing any level.
